# Patient Record
Sex: Female | Born: 1995 | Race: Black or African American | Hispanic: No | Marital: Single | Smoking: Never smoker
Health system: Southern US, Community
[De-identification: ages and names within clinical notes are randomized; demographics above are authoritative.]

## PROBLEM LIST (undated history)

## (undated) DIAGNOSIS — F909 Attention-deficit hyperactivity disorder, unspecified type: Secondary | ICD-10-CM

## (undated) DIAGNOSIS — F32A Depression, unspecified: Secondary | ICD-10-CM

## (undated) DIAGNOSIS — F329 Major depressive disorder, single episode, unspecified: Secondary | ICD-10-CM

## (undated) HISTORY — PX: NO PAST SURGERIES: SHX2092

---
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
## 2017-01-13 ENCOUNTER — Other Ambulatory Visit: Payer: Self-pay | Admitting: Primary Care

## 2017-01-13 DIAGNOSIS — Z3402 Encounter for supervision of normal first pregnancy, second trimester: Secondary | ICD-10-CM

## 2017-01-13 LAB — OB RESULTS CONSOLE VARICELLA ZOSTER ANTIBODY, IGG: Varicella: IMMUNE

## 2017-01-13 LAB — OB RESULTS CONSOLE RUBELLA ANTIBODY, IGM: Rubella: IMMUNE

## 2017-01-13 LAB — OB RESULTS CONSOLE GC/CHLAMYDIA
Chlamydia: NEGATIVE
Gonorrhea: NEGATIVE

## 2017-01-13 LAB — OB RESULTS CONSOLE HIV ANTIBODY (ROUTINE TESTING): HIV: NONREACTIVE

## 2017-01-13 LAB — OB RESULTS CONSOLE RPR: RPR: NONREACTIVE

## 2017-01-13 LAB — OB RESULTS CONSOLE HEPATITIS B SURFACE ANTIGEN: HEP B S AG: NEGATIVE

## 2017-02-16 ENCOUNTER — Ambulatory Visit
Admission: RE | Admit: 2017-02-16 | Discharge: 2017-02-16 | Disposition: A | Discharge status: Home / Self Care | Payer: Medicaid Other | Source: Ambulatory Visit | Attending: Primary Care | Admitting: Primary Care

## 2017-02-16 DIAGNOSIS — Z3689 Encounter for other specified antenatal screening: Secondary | ICD-10-CM | POA: Insufficient documentation

## 2017-02-16 DIAGNOSIS — Z3402 Encounter for supervision of normal first pregnancy, second trimester: Secondary | ICD-10-CM

## 2017-03-02 NOTE — L&D Delivery Note (Signed)
Date of delivery: 07/02/17 Estimated Date of Delivery: 07/02/17 Patient's last menstrual period was 09/25/2016. EGA: [redacted]w[redacted]d  Delivery Note At 3:07 AM a viable female was delivered via Vaginal, Spontaneous (Presentation: cephalic; ROA ).  APGAR: 9, 9; weight 7 lb 2.6 oz (3250 g).   Placenta status: sponatneous, intact.  Cord: 3vv, very thick wharton's jelly  with the following complications: none apparent.  Cord pH: not collected  Anesthesia:  None for delivery, lidocaine for repair Episiotomy: None Lacerations:  Anterior Left Labial and left sulcal Suture Repair: 3.0 vicryl rapide Est. Blood Loss (mL): 300   Mom presented to L&D with labor, progressed to complete, second stage: wtih delivery of fetal head with restitution to ROT.   Anterior then posterior shoulders delivered without difficulty.  Baby placed on mom's chest, and attended to by peds. Delayed cord clamping for >58min.  Cord was then clamped and cut by Grandmother.  Placenta spontaneously delivered, intact.   IV pitocin given for hemorrhage prophylaxis.  Left labial tear repaired with reapproximation to the vulva and sparing the clitoral hood of suture.  The left sulcal tear was repaired with running stitch of 3-0 vicryl.  Patient did not tolerate vaginal exams or manipulation.  We sang happy birthday to baby Halo.   Mom to postpartum.  Baby to Couplet care / Skin to Skin.  Chelsea C Ward 07/02/2017, 5:44 AM

## 2017-06-12 LAB — OB RESULTS CONSOLE GBS: STREP GROUP B AG: NEGATIVE

## 2017-06-30 ENCOUNTER — Other Ambulatory Visit: Payer: Self-pay | Admitting: Obstetrics and Gynecology

## 2017-06-30 NOTE — Progress Notes (Unsigned)
Orders placed for IOL. °

## 2017-07-02 ENCOUNTER — Other Ambulatory Visit: Payer: Self-pay

## 2017-07-02 ENCOUNTER — Inpatient Hospital Stay
Admission: EM | Admit: 2017-07-02 | Discharge: 2017-07-03 | DRG: 807 | Disposition: A | Discharge status: Home / Self Care | Payer: Medicaid Other | Source: Ambulatory Visit | Attending: Obstetrics & Gynecology | Admitting: Obstetrics & Gynecology

## 2017-07-02 DIAGNOSIS — O139 Gestational [pregnancy-induced] hypertension without significant proteinuria, unspecified trimester: Secondary | ICD-10-CM | POA: Diagnosis present

## 2017-07-02 DIAGNOSIS — O134 Gestational [pregnancy-induced] hypertension without significant proteinuria, complicating childbirth: Principal | ICD-10-CM | POA: Diagnosis present

## 2017-07-02 DIAGNOSIS — Z3A40 40 weeks gestation of pregnancy: Secondary | ICD-10-CM

## 2017-07-02 DIAGNOSIS — Z3483 Encounter for supervision of other normal pregnancy, third trimester: Secondary | ICD-10-CM | POA: Diagnosis present

## 2017-07-02 DIAGNOSIS — O0990 Supervision of high risk pregnancy, unspecified, unspecified trimester: Secondary | ICD-10-CM

## 2017-07-02 HISTORY — DX: Attention-deficit hyperactivity disorder, unspecified type: F90.9

## 2017-07-02 LAB — CBC
HEMATOCRIT: 35.2 % (ref 35.0–47.0)
HEMOGLOBIN: 12.4 g/dL (ref 12.0–16.0)
MCH: 27.6 pg (ref 26.0–34.0)
MCHC: 35.3 g/dL (ref 32.0–36.0)
MCV: 78.2 fL — AB (ref 80.0–100.0)
Platelets: 246 10*3/uL (ref 150–440)
RBC: 4.5 MIL/uL (ref 3.80–5.20)
RDW: 13.7 % (ref 11.5–14.5)
WBC: 12.4 10*3/uL — ABNORMAL HIGH (ref 3.6–11.0)

## 2017-07-02 LAB — TYPE AND SCREEN
ABO/RH(D): O POS
Antibody Screen: NEGATIVE

## 2017-07-02 MED ORDER — COCONUT OIL OIL
1.0000 | TOPICAL_OIL | Status: DC | PRN
Start: 2017-07-02 — End: 2017-07-03
  Filled 2017-07-02: qty 120

## 2017-07-02 MED ORDER — ACETAMINOPHEN 500 MG PO TABS: 1000.0000 mg | ORAL_TABLET | Freq: Four times a day (QID) | ORAL | Status: DC | PRN

## 2017-07-02 MED ORDER — HYDROMORPHONE HCL 1 MG/ML PO LIQD: 1.0000 mg | Freq: Once | ORAL | Status: DC

## 2017-07-02 MED ORDER — HYDROMORPHONE HCL 1 MG/ML IJ SOLN
INTRAMUSCULAR | Status: AC
  Administered 2017-07-02: 1 mg
  Filled 2017-07-02: qty 1

## 2017-07-02 MED ORDER — SIMETHICONE 80 MG PO CHEW: 80.0000 mg | CHEWABLE_TABLET | ORAL | Status: DC | PRN

## 2017-07-02 MED ORDER — MISOPROSTOL 200 MCG PO TABS
ORAL_TABLET | ORAL | Status: AC
  Filled 2017-07-02: qty 4

## 2017-07-02 MED ORDER — SOD CITRATE-CITRIC ACID 500-334 MG/5ML PO SOLN: 30.0000 mL | ORAL | Status: DC | PRN

## 2017-07-02 MED ORDER — HYDROCORTISONE 2.5 % RE CREA
TOPICAL_CREAM | RECTAL | Status: DC
  Filled 2017-07-02: qty 28.35

## 2017-07-02 MED ORDER — LIDOCAINE HCL (PF) 1 % IJ SOLN
INTRAMUSCULAR | Status: AC
  Administered 2017-07-02: 30 mL via SUBCUTANEOUS
  Filled 2017-07-02: qty 30

## 2017-07-02 MED ORDER — DOCUSATE SODIUM 100 MG PO CAPS
100.0000 mg | ORAL_CAPSULE | Freq: Two times a day (BID) | ORAL | Status: DC
  Administered 2017-07-02 – 2017-07-03 (×2): 100 mg via ORAL
  Filled 2017-07-02 (×2): qty 1

## 2017-07-02 MED ORDER — HYDROMORPHONE HCL 1 MG/ML IJ SOLN: 1.0000 mg | Freq: Once | INTRAMUSCULAR | Status: DC | PRN

## 2017-07-02 MED ORDER — LACTATED RINGERS IV SOLN: 500.0000 mL | INTRAVENOUS | Status: DC | PRN

## 2017-07-02 MED ORDER — WITCH HAZEL-GLYCERIN EX PADS
1.0000 "application " | MEDICATED_PAD | CUTANEOUS | Status: DC
  Filled 2017-07-02: qty 100

## 2017-07-02 MED ORDER — BENZOCAINE-MENTHOL 20-0.5 % EX AERO: 1.0000 "application " | INHALATION_SPRAY | CUTANEOUS | Status: DC | PRN

## 2017-07-02 MED ORDER — LACTATED RINGERS IV SOLN
INTRAVENOUS | Status: DC
  Administered 2017-07-02: 02:00:00 via INTRAVENOUS

## 2017-07-02 MED ORDER — AMMONIA AROMATIC IN INHA
RESPIRATORY_TRACT | Status: AC
  Filled 2017-07-02: qty 10

## 2017-07-02 MED ORDER — LIDOCAINE HCL (PF) 1 % IJ SOLN
30.0000 mL | INTRAMUSCULAR | Status: DC | PRN
  Administered 2017-07-02: 30 mL via SUBCUTANEOUS

## 2017-07-02 MED ORDER — ONDANSETRON HCL 4 MG/2ML IJ SOLN: 4.0000 mg | INTRAMUSCULAR | Status: DC | PRN

## 2017-07-02 MED ORDER — PRENATAL MULTIVITAMIN CH
1.0000 | ORAL_TABLET | Freq: Every day | ORAL | Status: DC
  Administered 2017-07-02 – 2017-07-03 (×2): 1 via ORAL
  Filled 2017-07-02 (×2): qty 1

## 2017-07-02 MED ORDER — ONDANSETRON HCL 4 MG/2ML IJ SOLN: 4.0000 mg | Freq: Four times a day (QID) | INTRAMUSCULAR | Status: DC | PRN

## 2017-07-02 MED ORDER — BUTORPHANOL TARTRATE 2 MG/ML IJ SOLN
1.0000 mg | INTRAMUSCULAR | Status: DC | PRN
  Administered 2017-07-02: 1 mg via INTRAVENOUS
  Filled 2017-07-02: qty 1

## 2017-07-02 MED ORDER — OXYTOCIN 10 UNIT/ML IJ SOLN
INTRAMUSCULAR | Status: AC
  Filled 2017-07-02: qty 2

## 2017-07-02 MED ORDER — OXYTOCIN BOLUS FROM INFUSION
500.0000 mL | Freq: Once | INTRAVENOUS | Status: AC
  Administered 2017-07-02: 500 mL via INTRAVENOUS

## 2017-07-02 MED ORDER — ONDANSETRON HCL 4 MG PO TABS: 4.0000 mg | ORAL_TABLET | ORAL | Status: DC | PRN

## 2017-07-02 MED ORDER — DIPHENHYDRAMINE HCL 25 MG PO CAPS: 25.0000 mg | ORAL_CAPSULE | Freq: Four times a day (QID) | ORAL | Status: DC | PRN

## 2017-07-02 MED ORDER — IBUPROFEN 600 MG PO TABS
600.0000 mg | ORAL_TABLET | Freq: Four times a day (QID) | ORAL | Status: DC
  Administered 2017-07-02 – 2017-07-03 (×4): 600 mg via ORAL
  Filled 2017-07-02 (×5): qty 1

## 2017-07-02 MED ORDER — OXYTOCIN 40 UNITS IN LACTATED RINGERS INFUSION - SIMPLE MED: 2.5000 [IU]/h | INTRAVENOUS | Status: DC

## 2017-07-02 NOTE — Discharge Summary (Signed)
Obstetrical Discharge Summary  Patient Name: Brittany Estrada DOB: 1996-03-01 MRN: [id number]  Date of Admission: 07/02/2017 Date of Delivery: 07/02/17 Delivered by: Ranae Plumber, MD Date of Discharge: 07/03/2017  Primary OB:   [facility]   YQM:VHQIONG'E last menstrual period was 09/25/2016. EDC Estimated Date of Delivery: 07/02/17 Gestational Age at Delivery: [redacted]w[redacted]d   Antepartum complications:  1. May have nephew with Down Syndrome 2. Anemia 3. H/o depression 4. Hypertension on admission   Admitting Diagnosis: labor, hypertension Secondary Diagnosis: Patient Active Problem List   Diagnosis Date Noted  . Labor and delivery indication for care or intervention 07/02/2017  . PIH (pregnancy induced hypertension) 07/02/2017  . High-risk pregnancy 07/02/2017    Augmentation: none Complications: None Intrapartum complications/course: Mom presented to L&D with labor, progressed to complete, second stage: wtih delivery of fetal head with restitution to ROT.   Anterior then posterior shoulders delivered without difficulty.  Baby placed on mom's chest, and attended to by peds. Delayed cord clamping for >56min.  Cord was then clamped and cut by Grandmother.  Placenta spontaneously delivered, intact.   IV pitocin given for hemorrhage prophylaxis.  Left labial tear repaired with reapproximation to the vulva and sparing the clitoral hood of suture.  The left sulcal tear was repaired with running stitch of 3-0 vicryl.  Patient did not tolerate vaginal exams or manipulation.  Date of Delivery: 07/02/17 Delivered By: Leeroy Bock Ward Delivery Type: spontaneous vaginal delivery Anesthesia: none Placenta: sponatneous Laceration: left labial and sulcal Episiotomy: none Newborn Data: Live born female "Halo" Birth Weight: 7 lb 2.6 oz (3250 g) APGAR: 9, 9  Newborn Delivery   Birth date/time:  07/02/2017 03:07:00 Delivery type:  Vaginal, Spontaneous     Postpartum Procedures: none  Post partum  course:  Patient had an uncomplicated postpartum course.  By time of discharge on PPD#1, her pain was controlled on oral pain medications; she had appropriate lochia and was ambulating, voiding without difficulty and tolerating regular diet.  She was deemed stable for discharge to home.     Discharge Physical Exam:  BP 130/86 (BP Location: Left Arm)   Pulse 75   Temp 98 F (36.7 C) (Oral)   Resp 18   Ht  (1.6 m)   Wt 134 lb (60.8 kg)   LMP 09/25/2016   SpO2 99%   Breastfeeding? Unknown   BMI 23.74 kg/m   General: NAD CV: RRR Pulm: CTABL, nl effort ABD: s/nd/nt, fundus firm and below the umbilicus Lochia: moderate DVT Evaluation: LE non-ttp, no evidence of DVT on exam.  Hemoglobin  Date Value Ref Range Status  07/03/2017 11.0 (L) 12.0 - 16.0 g/dL Final   HGB  Date Value Ref Range Status  04/11/2012 12.2 12.0 - 16.0 g/dL Final   HCT  Date Value Ref Range Status  07/03/2017 31.4 (L) 35.0 - 47.0 % Final  04/11/2012 36.3 35.0 - 47.0 % Final     Disposition: stable, discharge to home. Baby Feeding: breastmilk and formula Baby Disposition: home with mom  Rh Immune globulin given: n/a Rubella vaccine given: n/a Tdap vaccine given in AP or PP setting: AP Flu vaccine given in AP or PP setting: AP  Contraception: TBD  Prenatal Labs:  Blood type/Rh O+  Antibody screen neg  Rubella Immune  Varicella Immune  RPR NR  HBsAg Neg  HIV NR  GC neg  Chlamydia neg  Genetic screening Not done  1 hour GTT 81  3 hour GTT   GBS Neg 06/12/17  Plan:  VIRGINA DEAKINS was discharged to home in good condition. Follow-up appointment with Dr. Elesa Massed in 4-6 weeks.  Discharge Medications: Allergies as of 07/03/2017   No Known Allergies     Medication List    STOP taking these medications   amoxicillin-clavulanate 875-125 MG tablet Commonly known as:  AUGMENTIN   predniSONE 10 MG tablet Commonly known as:  DELTASONE     TAKE these medications    acetaminophen 500 MG tablet Commonly known as:  TYLENOL Take 2 tablets (1,000 mg total) by mouth every 6 (six) hours as needed (for pain scale < 4).   ferrous sulfate 325 (65 FE) MG EC tablet Take 325 mg by mouth 3 (three) times daily with meals.   ibuprofen 600 MG tablet Commonly known as:  ADVIL,MOTRIN Take 1 tablet (600 mg total) by mouth every 6 (six) hours.   lisdexamfetamine 40 MG capsule Commonly known as:  VYVANSE Take 40 mg by mouth daily.   prenatal multivitamin Tabs tablet Take 1 tablet by mouth daily at 12 noon.       Follow-up Information    Ward, Elenora Fender, MD Follow up in 2 week(s).   Specialty:  Obstetrics and Gynecology Why:  Please follow-up in 2 weeks for postpartum and BP recheck Contact information: [address] [place] [address] [phone]        [facility] Follow up in 6 week(s).   Specialty:  General Practice Why:  Make appointment in 6 weeks for postpartum follow-up Contact information: [address] [place] [address] [phone]           Signed: Heloise Ochoa A, CNM 07/03/2017 4:20 PM

## 2017-07-02 NOTE — H&P (Signed)
OB History & Physical   History of Present Illness:  Chief Complaint:   HPI:  Brittany Estrada is a 22 y.o. G1P0 female at [redacted]w[redacted]d dated by Patient's last menstrual period was 09/25/2016. confirmed with a 20 week ultrasound. Estimated Date of Delivery: 07/02/17  She presents to L&D for abdominal pain and is found to be in labor. She also has elevated blood pressures, which she has not had previously  +FM, + CTX, no LOF, no VB Denies: HA, visual changes, SOB, or RUQ/epigastric pain  Pregnancy Issues: 1. May have nephew with Down Syndrome 2. Anemia 3. H/o depression 4. Hypertension on admission  Maternal Medical History:   Past Medical History:  Diagnosis Date  . ADHD   . Depression     Past Surgical History:  Procedure Laterality Date  . NO PAST SURGERIES      No Known Allergies  Prior to Admission medications   Medication Sig Start Date End Date Taking? Authorizing Provider  ferrous sulfate 325 (65 FE) MG EC tablet Take 325 mg by mouth 3 (three) times daily with meals.   Yes [provider]  Prenatal Vit-Fe Fumarate-FA (PRENATAL MULTIVITAMIN) TABS tablet Take 1 tablet by mouth daily at 12 noon.   Yes [provider]  amoxicillin-clavulanate (AUGMENTIN) 875-125 MG tablet Take 1 tablet by mouth 2 (two) times daily. Patient not taking: Reported on 07/02/2017 04/27/15   Houston Siren, MD  lisdexamfetamine (VYVANSE) 40 MG capsule Take 40 mg by mouth daily.    [provider]  predniSONE (DELTASONE) 10 MG tablet Label  & dispense according to the schedule below. 5 Pills PO for 1 day then, 4 Pills PO for 1 day, 3 Pills PO for 1 day, 2 Pills PO for 1 day, 1 Pill PO for 1 days then STOP. Patient not taking: Reported on 07/02/2017 04/27/15   Houston Siren, MD     Pren[facility] care site: [facility]    Social History: She  reports that she has never smoked. She has never used smokeless tobacco. She reports that she does not drink alcohol or use  drugs.  Family History: family history includes Kidney failure in her father.   Review of Systems: A full review of systems was performed and negative except as noted in the HPI.     Physical Exam:  Vital Signs: BP (!) 155/106   Pulse 99   Temp 98.3 F (36.8 C) (Oral)   Resp 18   Ht  (1.6 m)   Wt 60.8 kg (134 lb)   BMI 23.74 kg/m  General: no acute distress.  HEENT: normocephalic, atraumatic Heart: regular rate & rhythm.  No murmurs/rubs/gallops Lungs: clear to auscultation bilaterally, normal respiratory effort Abdomen: soft, gravid, non-tender;  EFW: 7lbs Pelvic:   External: Normal external female genitalia  Cervix: Dilation: 3.5 / Effacement (%): 90 / Station: -1    Extremities: non-tender, symmetric, no edema bilaterally.  DTRs: 2+  Neurologic: Alert & oriented x 3.      Pertinent Results:  Prenatal Labs: Blood type/Rh O+  Antibody screen neg  Rubella Immune  Varicella Immune  RPR NR  HBsAg Neg  HIV NR  GC neg  Chlamydia neg  Genetic screening Not done  1 hour GTT 81  3 hour GTT   GBS Neg 06/12/17   FHT: 135 mod + accels no decels TOCO: q66min SVE:  Dilation: 3.5 / Effacement (%): 90 / Station: -1    Cephalic by leopolds   Assessment:  Brittany Estrada is a 22 y.o. G1P0 female at [redacted]w[redacted]d with labor.   Plan:  1. Admit to Labor & Delivery 2. CBC, T&S, Clrs, IVF 3. GBS  neg 4. Consents obtained. 5. Continuous efm/toco 6. Category 1 tracing 7. Expectant managment  ----- Ranae Plumber, MD Attending Obstetrician and Gynecologist [facility], Department of OB/GYN [facility]

## 2017-07-02 NOTE — Discharge Instructions (Signed)

## 2017-07-02 NOTE — Progress Notes (Signed)
Patient is doing well today. Fundus firm, bleeding small, pain controlled by scheduled motrin, ambulating independently. Flat affect noted, some smiles off and on- support person at bedside very involved.

## 2017-07-02 NOTE — OB Triage Note (Signed)
  Patient came in at 0015 from home via EMS complaining of lower abdominal pain and back pain that started at 0800 07/01/2017. Patient reports abdominal pain to be constant. Rating pain a 9 out of 10. Patient reports positive fetal movement. Reports scant vaginal bleeding when wiping. Reports to loosing mucous plug 07/01/2017. Denies leakage of fluids. Upon RN assessment contractions palpate soft. External fetal monitors applied. Initial blood pressure 144/100. Temperature 98.0 F. Will inform provider and continue to assess and monitor. Call bell within reach. Bed in lowest position. Side rails up. Family at bedside.

## 2017-07-02 NOTE — Progress Notes (Signed)
Post Partum Day 0 Subjective: Doing well, no complaints.  Tolerating regular diet, pain with PO meds, voiding and ambulating without difficulty.  No CP SOB Fever,Chills, N/V or leg pain; denies nipple or breast pain no HA change of vision, RUQ/epigastric pain  Objective: BP 128/84 (BP Location: Left Arm)   Pulse 86   Temp 98.1 F (36.7 C) (Oral)   Resp 17   Ht  (1.6 m)   Wt 134 lb (60.8 kg)   LMP 09/25/2016   SpO2 99%   Breastfeeding? Unknown   BMI 23.74 kg/m    Physical Exam:  General: NAD Breasts: soft/nontender CV: RRR Pulm: nl effort, CTABL Abdomen: soft, NT, BS x 4 Perineum: minimal edema, repair well approximated Lochia: moderate Uterine Fundus: fundus firm and 1 fb below umbilicus DVT Evaluation: no cords, ttp LEs   Recent Labs    07/02/17 0144  HGB 12.4  HCT 35.2  WBC 12.4*  PLT 246    Assessment/Plan: 22 y.o. G1P1001 postpartum day # 0  - Continue routine PP care - encouraged snug fitting bra and cabbage leaves for bottlefeeding.  - Discussed contraceptive options including implant, IUDs hormonal and non-hormonal, injection, pills/ring/patch, condoms, and NFP.  - Immunization status: all Imms up to date    Disposition: Does not desire Dc home today.     Renesha Lizama A, CNM 07/02/2017  1:28 PM

## 2017-07-03 LAB — CBC
HEMATOCRIT: 31.4 % — AB (ref 35.0–47.0)
HEMOGLOBIN: 11 g/dL — AB (ref 12.0–16.0)
MCH: 27.7 pg (ref 26.0–34.0)
MCHC: 34.9 g/dL (ref 32.0–36.0)
MCV: 79.4 fL — ABNORMAL LOW (ref 80.0–100.0)
Platelets: 196 10*3/uL (ref 150–440)
RBC: 3.96 MIL/uL (ref 3.80–5.20)
RDW: 13.4 % (ref 11.5–14.5)
WBC: 12.1 10*3/uL — AB (ref 3.6–11.0)

## 2017-07-03 LAB — RPR: RPR Ser Ql: NONREACTIVE

## 2017-07-03 MED ORDER — ACETAMINOPHEN 500 MG PO TABS: 1000.0000 mg | ORAL_TABLET | Freq: Four times a day (QID) | ORAL | 0 refills | Status: DC | PRN

## 2017-07-03 MED ORDER — IBUPROFEN 600 MG PO TABS: 600.0000 mg | ORAL_TABLET | Freq: Four times a day (QID) | ORAL | 0 refills | Status: DC

## 2017-07-03 NOTE — Progress Notes (Signed)
Post Partum Day 1 Subjective: Doing well, no complaints.  Tolerating regular diet, pain with PO meds, voiding and ambulating without difficulty.  No CP SOB Fever,Chills, N/V or leg pain; denies nipple or breast pain, no HA change of vision, RUQ/epigastric pain  Objective: BP 122/90 (BP Location: Right Arm)   Pulse 83   Temp 98.7 F (37.1 C) (Oral)   Resp 18   Ht  (1.6 m)   Wt 134 lb (60.8 kg)   LMP 09/25/2016   SpO2 99%   Breastfeeding? Unknown   BMI 23.74 kg/m    Physical Exam:  General: NAD Breasts: soft/nontender- bottlefeeding CV: RRR Pulm: nl effort, CTABL Abdomen: soft, NT, BS x 4 Perineum: minimal edema, intact/lacerations hemostatic/repair well approximated Lochia: moderate Uterine Fundus: fundus firm and 1 fb below umbilicus DVT Evaluation: no cords, ttp LEs   Recent Labs    07/02/17 0144 07/03/17 0524  HGB 12.4 11.0*  HCT 35.2 31.4*  WBC 12.4* 12.1*  PLT 246 196    Assessment/Plan: 22 y.o. G1P1001 postpartum day # 1  - Continue routine PP care - encouraged snug fitting bra and cabbage leaves for bottlefeeding.  - Discussed contraceptive options: pt unsure of desired method, has used nothing in the past.  Reviewed abstinence for 6weeks, consider OCPs, briefly discussed LARC methods - Immunization status:  all Imms up to date - intermittent mild range BP this am, asymptomatic, will recheck BP at lunch.   Disposition: desires DC today    McVey, REBECCA A, CNM 07/03/2017  9:18 AM

## 2017-07-03 NOTE — Progress Notes (Signed)
Reviewed D/C instructions with pt and family. Pt stated they have already watched purple cry video overnight on previous shift. Pt verbalized understanding of teaching. Discharged to home via W/C. Pt to schedule f/u appt in 2wks and 6wks. Will monitor BPs at home and verbalized understanding of when/why to call provider.

## 2017-08-22 ENCOUNTER — Encounter: Payer: Self-pay | Admitting: Emergency Medicine

## 2017-08-22 ENCOUNTER — Other Ambulatory Visit: Payer: Self-pay

## 2017-08-22 ENCOUNTER — Emergency Department
Admission: EM | Admit: 2017-08-22 | Discharge: 2017-08-22 | Disposition: A | Discharge status: Home / Self Care | Payer: Medicaid Other | Attending: Emergency Medicine | Admitting: Emergency Medicine

## 2017-08-22 DIAGNOSIS — B3731 Acute candidiasis of vulva and vagina: Secondary | ICD-10-CM

## 2017-08-22 DIAGNOSIS — B373 Candidiasis of vulva and vagina: Secondary | ICD-10-CM | POA: Insufficient documentation

## 2017-08-22 DIAGNOSIS — N898 Other specified noninflammatory disorders of vagina: Secondary | ICD-10-CM | POA: Diagnosis present

## 2017-08-22 DIAGNOSIS — Z79899 Other long term (current) drug therapy: Secondary | ICD-10-CM | POA: Insufficient documentation

## 2017-08-22 LAB — URINALYSIS, COMPLETE (UACMP) WITH MICROSCOPIC
Bilirubin Urine: NEGATIVE
Glucose, UA: NEGATIVE mg/dL
HGB URINE DIPSTICK: NEGATIVE
KETONES UR: NEGATIVE mg/dL
Nitrite: NEGATIVE
Protein, ur: NEGATIVE mg/dL
SPECIFIC GRAVITY, URINE: 1.006 (ref 1.005–1.030)
pH: 7 (ref 5.0–8.0)

## 2017-08-22 LAB — POCT PREGNANCY, URINE: PREG TEST UR: NEGATIVE

## 2017-08-22 MED ORDER — FLUCONAZOLE 50 MG PO TABS: 150.0000 mg | ORAL_TABLET | Freq: Every day | ORAL | 0 refills | Status: AC

## 2017-08-22 NOTE — ED Provider Notes (Signed)
[facility] Emergency Department Provider Note  ____________________________________________   First MD Initiated Contact with Patient 08/22/17 2337     (approximate)  I have reviewed the triage vital signs and the nursing notes.   HISTORY  Chief Complaint Vaginitis   HPI Brittany CromeBrittany S Estrada is a 22 y.o. female who self presents to the emergency department with 2 days of vaginal itching.  She gave birth about 2 months ago and has been asymptomatic until the last 2 days.  She notes a small red "rash" around the outside of her labia majora.  No dysuria.  No vaginal discharge.  No foul smell.  No fevers or chills.  No abdominal pain nausea vomiting.  No back pain.  She is taken no medications and nothing seems to work.  Past Medical History:  Diagnosis Date  . ADHD   . Depression     Patient Active Problem List   Diagnosis Date Noted  . Labor and delivery indication for care or intervention 07/02/2017  . PIH (pregnancy induced hypertension) 07/02/2017  . High-risk pregnancy 07/02/2017    Past Surgical History:  Procedure Laterality Date  . NO PAST SURGERIES      Prior to Admission medications   Medication Sig Start Date End Date Taking? Authorizing Provider  acetaminophen (TYLENOL) 500 MG tablet Take 2 tablets (1,000 mg total) by mouth every 6 (six) hours as needed (for pain scale < 4). 07/03/17   McVey, Prudencio Pairebecca A, CNM  ferrous sulfate 325 (65 FE) MG EC tablet Take 325 mg by mouth 3 (three) times daily with meals.    [provider]  fluconazole (DIFLUCAN) 50 MG tablet Take 3 tablets (150 mg total) by mouth daily for 1 day. 08/22/17 08/23/17  Merrily Brittleifenbark, Jerlean Peralta, MD  ibuprofen (ADVIL,MOTRIN) 600 MG tablet Take 1 tablet (600 mg total) by mouth every 6 (six) hours. 07/03/17   McVey, Prudencio Pairebecca A, CNM  lisdexamfetamine (VYVANSE) 40 MG capsule Take 40 mg by mouth daily.    [provider]  Prenatal Vit-Fe Fumarate-FA (PRENATAL MULTIVITAMIN) TABS  tablet Take 1 tablet by mouth daily at 12 noon.    [provider]    Allergies Patient has no known allergies.  Family History  Problem Relation Age of Onset  . Kidney failure Father     Social History Social History   Tobacco Use  . Smoking status: Never Smoker  . Smokeless tobacco: Never Used  Substance Use Topics  . Alcohol use: No    Alcohol/week: 0.0 oz  . Drug use: No    Review of Systems Constitutional: No fever/chills Cardiovascular: Denies chest pain. Respiratory: Denies shortness of breath. Gastrointestinal: No abdominal pain.  No nausea, no vomiting.   GU: Positive for vaginal itching Musculoskeletal: Negative for back pain. Neurological: Negative for headaches   ____________________________________________   PHYSICAL EXAM:  VITAL SIGNS: ED Triage Vitals  Enc Vitals Group     BP 08/22/17 2211 118/66     Pulse Rate 08/22/17 2211 88     Resp 08/22/17 2211 18     Temp 08/22/17 2211 98.7 F (37.1 C)     Temp Source 08/22/17 2211 Oral     SpO2 08/22/17 2211 99 %     Weight 08/22/17 2209 130 lb (59 kg)     Height 08/22/17 2209 5\' 4"  (1.626 m)     Head Circumference --      Peak Flow --      Pain Score 08/22/17 2209 6  Pain Loc --      Pain Edu? --      Excl. in GC? --     Constitutional: Alert and oriented x4 pleasant cooperative speaks full clear sentences no diaphoresis Head: Atraumatic. Nose: No congestion/rhinnorhea. Mouth/Throat: No trismus Neck: No stridor.   Cardiovascular: Regular rate and rhythm Respiratory: Normal respiratory effort.  No retractions. Neurologic:  Normal speech and language. No gross focal neurologic deficits are appreciated.  Skin:  Skin is warm, dry and intact. No rash noted.    ____________________________________________  LABS (all labs ordered are listed, but only abnormal results are displayed)  Labs Reviewed  URINALYSIS, COMPLETE (UACMP) WITH MICROSCOPIC - Abnormal; Notable for the following  components:      Result Value   Color, Urine STRAW (*)    APPearance HAZY (*)    Leukocytes, UA SMALL (*)    Bacteria, UA FEW (*)    All other components within normal limits  POCT PREGNANCY, URINE    Lab work reviewed by me with small leukocytes and not pregnant __________________________________________  EKG   ____________________________________________  RADIOLOGY   ____________________________________________   DIFFERENTIAL includes but not limited to  Vulvovaginitis, syphilis, gonorrhea, UTI, pyelonephritis   PROCEDURES  Procedure(s) performed: no  Procedures  Critical Care performed: no  Observation: no ____________________________________________   INITIAL IMPRESSION / ASSESSMENT AND PLAN / ED COURSE  Pertinent labs & imaging results that were available during my care of the patient were reviewed by me and considered in my medical decision making (see chart for details).  The patient's symptoms are most consistent with candidal vulvovaginitis.  No need for pelvic exam unless she fails outpatient management.  I will prescribe her Diflucan for home but also I explained to the patient she could take over-the-counter Monistat.  Discharged home in improved condition verbalizes understanding agreement the plan.      ____________________________________________   FINAL CLINICAL IMPRESSION(S) / ED DIAGNOSES  Final diagnoses:  Candidal vulvovaginitis      NEW MEDICATIONS STARTED DURING THIS VISIT:  Discharge Medication List as of 08/22/2017 11:38 PM    START taking these medications   Details  fluconazole (DIFLUCAN) 50 MG tablet Take 3 tablets (150 mg total) by mouth daily for 1 day., Starting Sun 08/22/2017, Until Mon 08/23/2017, Print         Note:  This document was prepared using Dragon voice recognition software and may include unintentional dictation errors.      Merrily Brittle, MD 08/23/17 [phone]

## 2017-08-22 NOTE — Discharge Instructions (Signed)
It was a pleasure to take care of you today, and thank you for coming to our emergency department.  If you have any questions or concerns before leaving please ask the nurse to grab me and I'm more than happy to go through your aftercare instructions again.  If you were prescribed any opioid pain medication today such as Norco, Vicodin, Percocet, morphine, hydrocodone, or oxycodone please make sure you do not drive when you are taking this medication as it can alter your ability to drive safely.  If you have any concerns once you are home that you are not improving or are in fact getting worse before you can make it to your follow-up appointment, please do not hesitate to call 911 and come back for further evaluation.  Merrily BrittleNeil Anamari Galeas, MD  Results for orders placed or performed during the hospital encounter of 08/22/17  Urinalysis, Complete w Microscopic  Result Value Ref Range   Color, Urine STRAW (A) YELLOW   APPearance HAZY (A) CLEAR   Specific Gravity, Urine 1.006 1.005 - 1.030   pH 7.0 5.0 - 8.0   Glucose, UA NEGATIVE NEGATIVE mg/dL   Hgb urine dipstick NEGATIVE NEGATIVE   Bilirubin Urine NEGATIVE NEGATIVE   Ketones, ur NEGATIVE NEGATIVE mg/dL   Protein, ur NEGATIVE NEGATIVE mg/dL   Nitrite NEGATIVE NEGATIVE   Leukocytes, UA SMALL (A) NEGATIVE   RBC / HPF 0-5 0 - 5 RBC/hpf   WBC, UA 0-5 0 - 5 WBC/hpf   Bacteria, UA FEW (A) NONE SEEN   Squamous Epithelial / LPF 0-5 0 - 5  Pregnancy, urine POC  Result Value Ref Range   Preg Test, Ur NEGATIVE NEGATIVE

## 2017-08-22 NOTE — ED Triage Notes (Signed)
Pt arrives ambulatory to triage with c/o yeast infection in her vaginal area. Pt reports itching and swelling but denies bleeding or DC. Pt is in NAD.

## 2017-09-26 IMAGING — CT CT NECK W/ CM
2 of 3 series · 7 of 14 positions shown, 8 images · IV contrast (omnipaque)
Comparison: None.

ADDENDUM:
Slight nodularity along the posterior wall of the upper trachea, see
for instance image 69 series 2, does not contribute to significant
subglottic narrowing.
CLINICAL DATA: Sore throat, congestion, and body aches. These are
worse on the RIGHT.

EXAM:
CT NECK WITH CONTRAST
TECHNIQUE: Multidetector CT imaging of the neck was performed using the
standard protocol following the bolus administration of intravenous
contrast.
CONTRAST:  75mL OMNIPAQUE IOHEXOL 300 MG/ML  SOLN

[Series 2: axial neck · axial · 0.44mm/px · z∈[+536,+668]mm · 4 of 110 slices shown]
[im 22/110  bone]
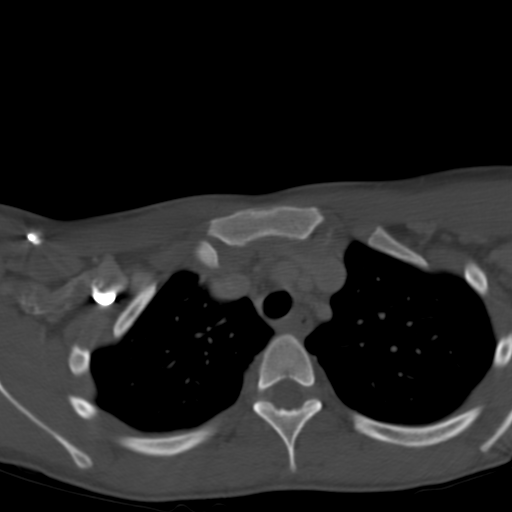
[im 44/110  bone]
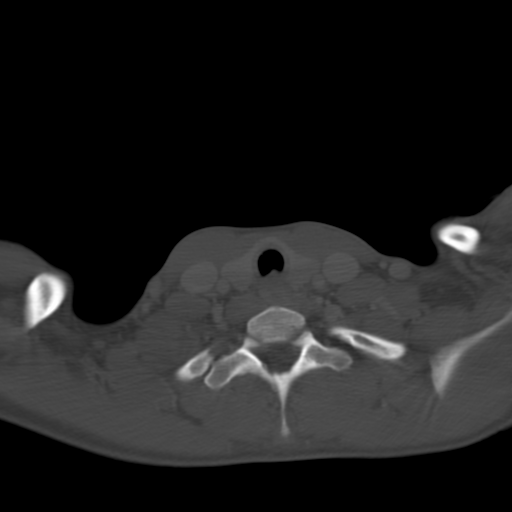
[im 66/110  bone]
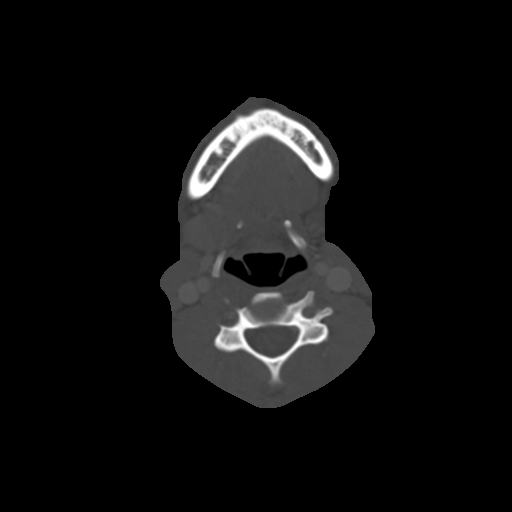
[im 88/110  bone]
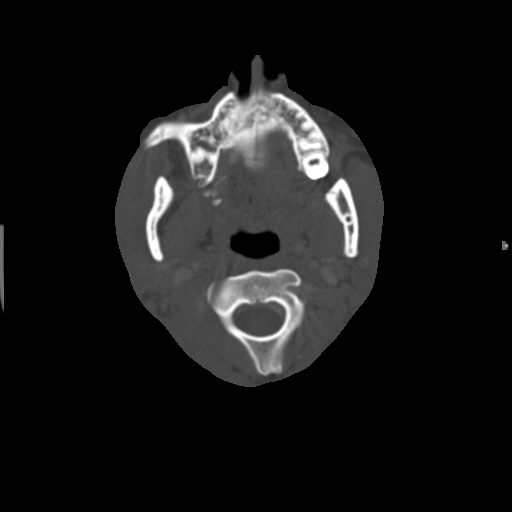

[Series 6: ax oropharynx · axial · 0.44mm/px · z∈[+542,+652]mm · 3 of 111 slices shown, 4 images]
[im 28/111  soft-tissue]
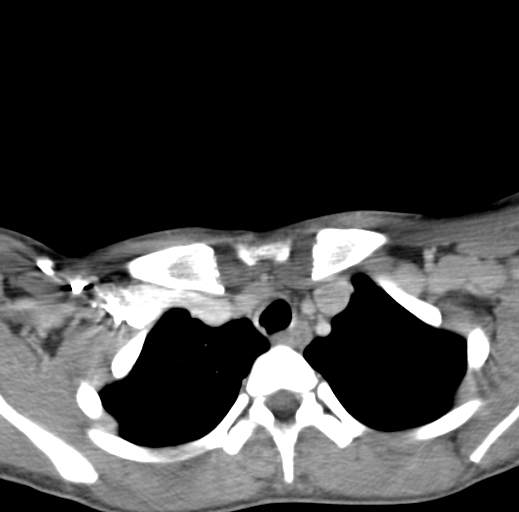
[im 28/111  bone]
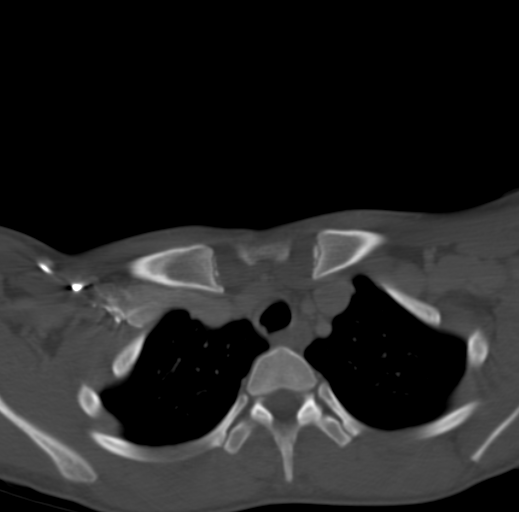
[im 56/111  bone]
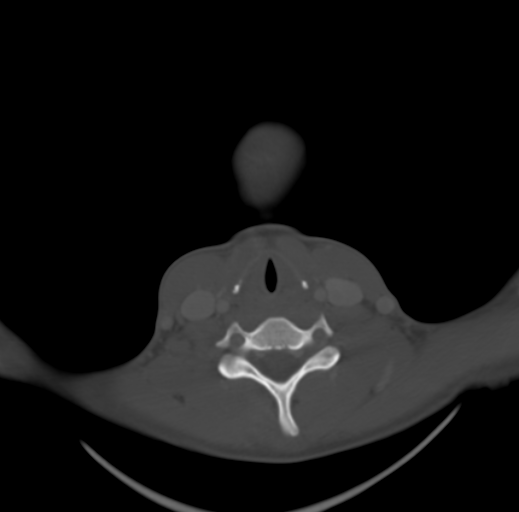
[im 83/111  bone]
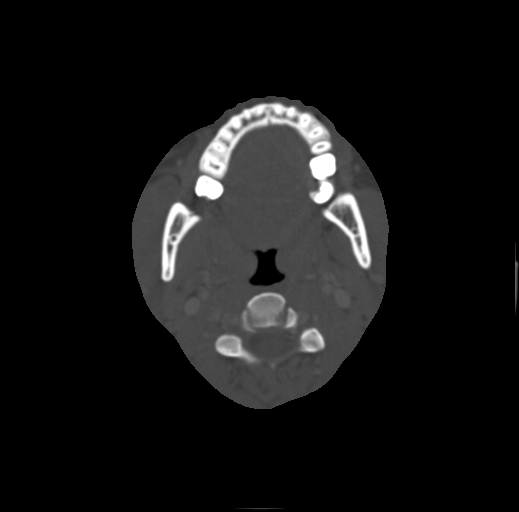

[7 of 14 positions shown; findings below may reference images not displayed]

FINDINGS: Pharynx and larynx: Asymmetric enlargement of the RIGHT
nasopharyngeal adenoid/lymphoid tissue. No middle ear fluid. Trace
RIGHT mastoid effusion. No lingual/faucial tonsillar or
peritonsillar abscess. No retropharyngeal effusion or abscess.
Normal hypopharynx and larynx.

Salivary glands: Negative

Thyroid: Negative

Lymph nodes: Probable mild reactive adenopathy.

Vascular: Patent.

Limited intracranial: Negative.

Visualized orbits: Negative.

Mastoids and visualized paranasal sinuses: Trace RIGHT mastoid
effusion. No sinus air-fluid level. Small mastoid sinuses.

Skeleton: No worrisome osseous lesion.

Upper chest: Lung apices clear.
IMPRESSION: Asymmetric enlargement of the RIGHT nasopharyngeal adenoid/lymphoid
tissue. Findings consistent with uncomplicated
pharyngitis/tonsillitis. No tonsillar or peritonsillar abscess. No
retropharyngeal effusion or abscess.

## 2018-07-13 ENCOUNTER — Emergency Department
Admission: EM | Admit: 2018-07-13 | Discharge: 2018-07-13 | Disposition: A | Discharge status: Home / Self Care | Payer: Medicaid Other | Attending: Emergency Medicine | Admitting: Emergency Medicine

## 2018-07-13 ENCOUNTER — Encounter: Payer: Self-pay | Admitting: *Deleted

## 2018-07-13 ENCOUNTER — Other Ambulatory Visit: Payer: Self-pay

## 2018-07-13 DIAGNOSIS — Z79899 Other long term (current) drug therapy: Secondary | ICD-10-CM | POA: Insufficient documentation

## 2018-07-13 DIAGNOSIS — R109 Unspecified abdominal pain: Secondary | ICD-10-CM | POA: Insufficient documentation

## 2018-07-13 DIAGNOSIS — R112 Nausea with vomiting, unspecified: Secondary | ICD-10-CM

## 2018-07-13 LAB — URINALYSIS, COMPLETE (UACMP) WITH MICROSCOPIC
Bacteria, UA: NONE SEEN
Bilirubin Urine: NEGATIVE
Glucose, UA: NEGATIVE mg/dL
Ketones, ur: 80 mg/dL — AB
Leukocytes,Ua: NEGATIVE
Nitrite: NEGATIVE
Protein, ur: 30 mg/dL — AB
Specific Gravity, Urine: 1.031 — ABNORMAL HIGH (ref 1.005–1.030)
pH: 5 (ref 5.0–8.0)

## 2018-07-13 LAB — CBC
HCT: 40.6 % (ref 36.0–46.0)
Hemoglobin: 13.4 g/dL (ref 12.0–15.0)
MCH: 27.9 pg (ref 26.0–34.0)
MCHC: 33 g/dL (ref 30.0–36.0)
MCV: 84.6 fL (ref 80.0–100.0)
Platelets: 334 10*3/uL (ref 150–400)
RBC: 4.8 MIL/uL (ref 3.87–5.11)
RDW: 11.9 % (ref 11.5–15.5)
WBC: 7.1 10*3/uL (ref 4.0–10.5)
nRBC: 0 % (ref 0.0–0.2)

## 2018-07-13 LAB — COMPREHENSIVE METABOLIC PANEL
ALT: 10 U/L (ref 0–44)
AST: 16 U/L (ref 15–41)
Albumin: 4.5 g/dL (ref 3.5–5.0)
Alkaline Phosphatase: 40 U/L (ref 38–126)
Anion gap: 10 (ref 5–15)
BUN: 16 mg/dL (ref 6–20)
CO2: 21 mmol/L — ABNORMAL LOW (ref 22–32)
Calcium: 9.4 mg/dL (ref 8.9–10.3)
Chloride: 107 mmol/L (ref 98–111)
Creatinine, Ser: 0.81 mg/dL (ref 0.44–1.00)
GFR calc Af Amer: >60 mL/min (ref >60)
GFR calc non Af Amer: >60 mL/min (ref >60)
Glucose, Bld: 80 mg/dL (ref 70–99)
Potassium: 3.5 mmol/L (ref 3.5–5.1)
Sodium: 138 mmol/L (ref 135–145)
Total Bilirubin: 1.7 mg/dL — ABNORMAL HIGH (ref 0.3–1.2)
Total Protein: 8.2 g/dL — ABNORMAL HIGH (ref 6.5–8.1)

## 2018-07-13 LAB — URINE DRUG SCREEN, QUALITATIVE (ARMC ONLY)
Amphetamines, Ur Screen: NOT DETECTED
Barbiturates, Ur Screen: NOT DETECTED
Benzodiazepine, Ur Scrn: NOT DETECTED
Cannabinoid 50 Ng, Ur ~~LOC~~: POSITIVE — AB
Cocaine Metabolite,Ur ~~LOC~~: NOT DETECTED
MDMA (Ecstasy)Ur Screen: NOT DETECTED
Methadone Scn, Ur: NOT DETECTED
Opiate, Ur Screen: NOT DETECTED
Phencyclidine (PCP) Ur S: NOT DETECTED
Tricyclic, Ur Screen: NOT DETECTED

## 2018-07-13 LAB — POCT PREGNANCY, URINE: Preg Test, Ur: NEGATIVE

## 2018-07-13 LAB — LIPASE, BLOOD: Lipase: 28 U/L (ref 11–51)

## 2018-07-13 MED ORDER — ONDANSETRON 4 MG PO TBDP
4.0000 mg | ORAL_TABLET | Freq: Once | ORAL | Status: AC
  Administered 2018-07-13: 19:00:00 4 mg via ORAL
  Filled 2018-07-13: qty 1

## 2018-07-13 NOTE — Discharge Instructions (Signed)
You should stay on a bland diet for at least the next 12 hours then progress to your normal diet as tolerated.  Please follow-up with your primary care provider for symptoms that are not improving over the next couple of days.  If you are unable to see primary care and have concerns, for return to the emergency department.

## 2018-07-13 NOTE — ED Triage Notes (Signed)
Pt has abd pain and vomiting since yesterday.  No diarrhea.  Menses now.  No urinary  sx.  Pt alert.

## 2018-07-13 NOTE — ED Provider Notes (Signed)
[facility] Emergency Department Provider Note  ____________________________________________  Time seen: Approximately 5:43 PM  I have reviewed the triage vital signs and the nursing notes.   HISTORY  Chief Complaint Abdominal Pain and Emesis    HPI Brittany Estrada is a 23 y.o. female who presents to the emergency department for treatment and evaluation of nausea and vomiting.  Symptoms started last night.  Patient states that she had chills and felt sick in her stomach before going to bed.  She states that she awakened 3 separate times and vomited up "clear stuff."  She denies fever and states that she checked it with a thermometer which read 98.  She is currently on her menstrual cycle and states that it is currently heavy.  She has a history of anemia and was concerned that may be her hemoglobin has dropped which caused all of her symptoms.  She estimates that she is going through a pad or tampon every couple of hours which is normal for her.   Past Medical History:  Diagnosis Date  . ADHD   . Depression     Patient Active Problem List   Diagnosis Date Noted  . Labor and delivery indication for care or intervention 07/02/2017  . PIH (pregnancy induced hypertension) 07/02/2017  . High-risk pregnancy 07/02/2017    Past Surgical History:  Procedure Laterality Date  . NO PAST SURGERIES      Prior to Admission medications   Medication Sig Start Date End Date Taking? Authorizing Provider  acetaminophen (TYLENOL) 500 MG tablet Take 2 tablets (1,000 mg total) by mouth every 6 (six) hours as needed (for pain scale < 4). 07/03/17   McVey, Prudencio Pairebecca A, CNM  ferrous sulfate 325 (65 FE) MG EC tablet Take 325 mg by mouth 3 (three) times daily with meals.    [provider]  ibuprofen (ADVIL,MOTRIN) 600 MG tablet Take 1 tablet (600 mg total) by mouth every 6 (six) hours. 07/03/17   McVey, Prudencio Pairebecca A, CNM  lisdexamfetamine (VYVANSE) 40 MG capsule Take 40 mg by  mouth daily.    [provider]  Prenatal Vit-Fe Fumarate-FA (PRENATAL MULTIVITAMIN) TABS tablet Take 1 tablet by mouth daily at 12 noon.    [provider]    Allergies Patient has no known allergies.  Family History  Problem Relation Age of Onset  . Kidney failure Father     Social History Social History   Tobacco Use  . Smoking status: Never Smoker  . Smokeless tobacco: Never Used  Substance Use Topics  . Alcohol use: No    Alcohol/week: 0.0 standard drinks  . Drug use: No    Review of Systems Constitutional: Negative for fever. Respiratory: Negative for shortness of breath or cough. Gastrointestinal: Positive for abdominal pain earlier today-resolved; positive for nausea , positive for vomiting. Genitourinary: Negative for dysuria , negative for vaginal discharge. Negative for dyspareunia or concern for STD. Musculoskeletal: Negative for back pain. Skin: Negative for acute skin changes/rash/lesion. ____________________________________________   PHYSICAL EXAM:  VITAL SIGNS: ED Triage Vitals  Enc Vitals Group     BP 07/13/18 1645 (!) 167/82     Pulse Rate 07/13/18 1645 82     Resp 07/13/18 1645 18     Temp 07/13/18 1645 99 F (37.2 C)     Temp Source 07/13/18 1645 Oral     SpO2 07/13/18 1645 100 %     Weight 07/13/18 1646 120 lb (54.4 kg)     Height 07/13/18  1646 5\' 3"  (1.6 m)     Head Circumference --      Peak Flow --      Pain Score 07/13/18 1646 5     Pain Loc --      Pain Edu? --      Excl. in GC? --     Constitutional: Alert and oriented. Well appearing and in no acute distress. Eyes: Conjunctivae are normal. Head: Atraumatic. Nose: No congestion/rhinnorhea. Mouth/Throat: Mucous membranes are moist. Respiratory: Normal respiratory effort.  No retractions. Gastrointestinal: Bowel sounds active x 4; Abdomen is soft without rebound or guarding. Genitourinary: Pelvic exam: Not indicated Musculoskeletal: No extremity tenderness nor  edema.  Neurologic:  Normal speech and language. No gross focal neurologic deficits are appreciated. Speech is normal. No gait instability. Skin:  Skin is warm, dry and intact. No rash noted on exposed skin. Psychiatric: Mood and affect are normal. Speech and behavior are normal.  ____________________________________________   LABS (all labs ordered are listed, but only abnormal results are displayed)  Labs Reviewed  COMPREHENSIVE METABOLIC PANEL - Abnormal; Notable for the following components:      Result Value   CO2 21 (*)    Total Protein 8.2 (*)    Total Bilirubin 1.7 (*)    All other components within normal limits  URINALYSIS, COMPLETE (UACMP) WITH MICROSCOPIC - Abnormal; Notable for the following components:   Color, Urine YELLOW (*)    APPearance CLEAR (*)    Specific Gravity, Urine 1.031 (*)    Hgb urine dipstick SMALL (*)    Ketones, ur 80 (*)    Protein, ur 30 (*)    All other components within normal limits  URINE DRUG SCREEN, QUALITATIVE ([facility] ONLY) - Abnormal; Notable for the following components:   Cannabinoid 50 Ng, Ur [place] POSITIVE (*)    All other components within normal limits  LIPASE, BLOOD  CBC  POCT PREGNANCY, URINE   ____________________________________________  RADIOLOGY  Not indicated ____________________________________________  Procedures  ____________________________________________  23 year old female presenting to the emergency department after onset of abdominal pain and 3 episodes of vomiting with out fever or diarrhea.  Abdominal pain has since stopped and she is now able to tolerate water.  Patient states that he she has not attempted to eat anything today.  No vomiting since sometime in the middle of the night.  Plan to give Zofran ODT and then p.o. challenge.  ----------------------------------------- 7:25 PM on 07/13/2018 -----------------------------------------  Patient still tolerating ginger ale and saltine crackers.  She  states that she feels like she is ready to go home.  She denies abdominal pain or nausea.  She was encouraged to continue a bland diet for the next 12 hours or so then progress to normal diet as tolerated.  She is to follow-up with her primary care provider for symptoms of concern or return to the emergency department if unable to schedule an appointment.  INITIAL IMPRESSION / ASSESSMENT AND PLAN / ED COURSE  Pertinent labs & imaging results that were available during my care of the patient were reviewed by me and considered in my medical decision making (see chart for details).  ____________________________________________   FINAL CLINICAL IMPRESSION(S) / ED DIAGNOSES  Final diagnoses:  Intractable vomiting with nausea, unspecified vomiting type    Note:  This document was prepared using Dragon voice recognition software and may include unintentional dictation errors.   Chinita Pester, FNP 07/13/18 1926    Sharman Cheek, MD 07/13/18 2322

## 2018-11-11 ENCOUNTER — Emergency Department
Admission: EM | Admit: 2018-11-11 | Discharge: 2018-11-11 | Disposition: A | Discharge status: Home / Self Care | Payer: Medicaid Other | Attending: Emergency Medicine | Admitting: Emergency Medicine

## 2018-11-11 ENCOUNTER — Other Ambulatory Visit: Payer: Self-pay

## 2018-11-11 DIAGNOSIS — Z79899 Other long term (current) drug therapy: Secondary | ICD-10-CM | POA: Insufficient documentation

## 2018-11-11 DIAGNOSIS — Y939 Activity, unspecified: Secondary | ICD-10-CM | POA: Diagnosis not present

## 2018-11-11 DIAGNOSIS — S3992XA Unspecified injury of lower back, initial encounter: Secondary | ICD-10-CM | POA: Diagnosis present

## 2018-11-11 DIAGNOSIS — S39012A Strain of muscle, fascia and tendon of lower back, initial encounter: Secondary | ICD-10-CM | POA: Insufficient documentation

## 2018-11-11 DIAGNOSIS — Z0279 Encounter for issue of other medical certificate: Secondary | ICD-10-CM | POA: Diagnosis not present

## 2018-11-11 DIAGNOSIS — Y999 Unspecified external cause status: Secondary | ICD-10-CM | POA: Diagnosis not present

## 2018-11-11 DIAGNOSIS — Y929 Unspecified place or not applicable: Secondary | ICD-10-CM | POA: Diagnosis not present

## 2018-11-11 DIAGNOSIS — X58XXXA Exposure to other specified factors, initial encounter: Secondary | ICD-10-CM | POA: Insufficient documentation

## 2018-11-11 NOTE — ED Triage Notes (Signed)
Lower back pain without injury X 1 week. Recently started job where she is on her feet a lot. Needs a work note. Pt alert and oriented X4, cooperative, RR even and unlabored, color WNL. Pt in NAD.

## 2018-11-11 NOTE — ED Provider Notes (Signed)
[facility] Emergency Department Provider Note  ____________________________________________   First MD Initiated Contact with Patient 11/11/18 1234     (approximate)  I have reviewed the triage vital signs and the nursing notes.   HISTORY  Chief Complaint Back Pain    HPI KATILIN RAYNES is a 23 y.o. female presents to the ED with C/o low back pain for 2 day, not known injury, pain is worse with movement, increased with bending over, denies numbness, tingling, or changes in bowel/urinary habits,  Using otc meds with mild relief.  Denies UTI symptoms or vaginal discharge.  Patient states she really just needs a work note.  She would like me to backdate it for yesterday. Remainder ros neg   Past Medical History:  Diagnosis Date  . ADHD   . Depression     Patient Active Problem List   Diagnosis Date Noted  . Labor and delivery indication for care or intervention 07/02/2017  . PIH (pregnancy induced hypertension) 07/02/2017  . High-risk pregnancy 07/02/2017    Past Surgical History:  Procedure Laterality Date  . NO PAST SURGERIES      Prior to Admission medications   Medication Sig Start Date End Date Taking? Authorizing Provider  ferrous sulfate 325 (65 FE) MG EC tablet Take 325 mg by mouth 3 (three) times daily with meals.    [provider]  ibuprofen (ADVIL,MOTRIN) 600 MG tablet Take 1 tablet (600 mg total) by mouth every 6 (six) hours. 07/03/17   McVey, Murray Hodgkins, CNM  lisdexamfetamine (VYVANSE) 40 MG capsule Take 40 mg by mouth daily.    [provider]    Allergies Patient has no known allergies.  Family History  Problem Relation Age of Onset  . Kidney failure Father     Social History Social History   Tobacco Use  . Smoking status: Never Smoker  . Smokeless tobacco: Never Used  Substance Use Topics  . Alcohol use: No    Alcohol/week: 0.0 standard drinks  . Drug use: No    Review of Systems   Constitutional: No fever/chills Eyes: No visual changes. ENT: No sore throat. Respiratory: Denies cough Genitourinary: Negative for dysuria. Musculoskeletal: Positive for back pain. Skin: Negative for rash.    ____________________________________________   PHYSICAL EXAM:  VITAL SIGNS: ED Triage Vitals [11/11/18 1217]  Enc Vitals Group     BP 137/87     Pulse Rate 76     Resp 16     Temp 99.3 F (37.4 C)     Temp Source Oral     SpO2 100 %     Weight 109 lb (49.4 kg)     Height 5\' 3"  (1.6 m)     Head Circumference      Peak Flow      Pain Score 7     Pain Loc      Pain Edu?      Excl. in [place]?     Constitutional: Alert and oriented. Well appearing and in no acute distress. Eyes: Conjunctivae are normal.  Head: Atraumatic. Nose: No congestion/rhinnorhea. Mouth/Throat: Mucous membranes are moist.   Neck:  supple no lymphadenopathy noted Cardiovascular: Normal rate, regular rhythm.  Respiratory: Normal respiratory effort.  No retractions,  GU: deferred Musculoskeletal: FROM all extremities, warm and well perfused.  Full rom of back, lumbar spine nontender, negative slr, 5/5 strength in great toes b/l, 5/5 strength in lower legs, n/v intact Neurologic:  Normal speech and language.  Skin:  Skin is warm, dry and intact. No rash noted. Psychiatric: Mood and affect are normal. Speech and behavior are normal.  ____________________________________________   LABS (all labs ordered are listed, but only abnormal results are displayed)  Labs Reviewed - No data to display ____________________________________________   ____________________________________________  RADIOLOGY    ____________________________________________   PROCEDURES  Procedure(s) performed: No  Procedures    ____________________________________________   INITIAL IMPRESSION / ASSESSMENT AND PLAN / ED COURSE  Pertinent labs & imaging results that were available during my care of the  patient were reviewed by me and considered in my medical decision making (see chart for details).   Patient is a 23 year old female presents emergency department complaining of back pain.  Basically she called out of work yesterday and was supposed to go to the doctor but did not need to work note.  She denies any numbness or tingling.  No known injury.  She took ibuprofen with relief.  Physical exam is completely normal.  Explained findings to the patient.  She is given a work note stating she can go back to work today.  She is to continue take ibuprofen if needed.  Follow-up with her regular doctor if not better in 3 to 5 days.  Return if worsening.     As part of my medical decision making, I reviewed the following data within the electronic MEDICAL RECORD NUMBER Nursing notes reviewed and incorporated, Old chart reviewed, Notes from prior ED visits and [place] Controlled Substance Database  ____________________________________________   FINAL CLINICAL IMPRESSION(S) / ED DIAGNOSES  Final diagnoses:  Lumbar strain, initial encounter      NEW MEDICATIONS STARTED DURING THIS VISIT:  New Prescriptions   No medications on file     Note:  This document was prepared using Dragon voice recognition software and may include unintentional dictation errors.     Faythe GheeFisher, Susan W, PA-C 11/11/18 1246    Shaune PollackIsaacs, Cameron, MD 11/13/18 [phone]

## 2018-11-11 NOTE — ED Notes (Signed)
See triage note  Presents with lower back pain  States she has a hx of back pain since she was a child  But her back is hurting more  Esp since she has been working   Denies any injury  States IBU helps with the pain

## 2018-11-11 NOTE — Discharge Instructions (Signed)
Try to have good posture.  Use supportive shoes while walking and standing for long periods.  Take ibuprofen for pain as needed.  Apply ice to the lower back.  Return if worsening.

## 2020-02-23 ENCOUNTER — Other Ambulatory Visit: Payer: Self-pay

## 2020-02-23 ENCOUNTER — Emergency Department
Admission: EM | Admit: 2020-02-23 | Discharge: 2020-02-23 | Disposition: A | Discharge status: Home / Self Care | Payer: Medicaid Other | Attending: Emergency Medicine | Admitting: Emergency Medicine

## 2020-02-23 DIAGNOSIS — J029 Acute pharyngitis, unspecified: Secondary | ICD-10-CM | POA: Diagnosis present

## 2020-02-23 DIAGNOSIS — R519 Headache, unspecified: Secondary | ICD-10-CM | POA: Insufficient documentation

## 2020-02-23 DIAGNOSIS — Z20822 Contact with and (suspected) exposure to covid-19: Secondary | ICD-10-CM | POA: Diagnosis not present

## 2020-02-23 LAB — RESP PANEL BY RT-PCR (FLU A&B, COVID) ARPGX2
Influenza A by PCR: NEGATIVE
Influenza B by PCR: NEGATIVE
SARS Coronavirus 2 by RT PCR: NEGATIVE

## 2020-02-23 NOTE — ED Triage Notes (Signed)
Pt presents via POV c/o sore throat, headache, and body aches x3 days. Reports + Covid exposure at work.

## 2020-02-23 NOTE — Discharge Instructions (Signed)
Follow-up with your regular doctor if not improved in 3 days.  If worsening please return emergency department for additional Covid testing. Covid test negative today.  May return to work for your next scheduled shift

## 2020-02-23 NOTE — ED Provider Notes (Signed)
[facility] Emergency Department Provider Note  ____________________________________________   Event Date/Time   First MD Initiated Contact with Patient 02/23/20 1948     (approximate)  I have reviewed the triage vital signs and the nursing notes.   HISTORY  Chief Complaint Headache    HPI Brittany Estrada is a 24 y.o. female presents emergency department complaint of sore throat, headache and body aches for 3 days.  Patient had a positive Covid exposure at work.  She denies fever at this time.  No vomiting or diarrhea    Past Medical History:  Diagnosis Date   ADHD    Depression     Patient Active Problem List   Diagnosis Date Noted   Labor and delivery indication for care or intervention 07/02/2017   PIH (pregnancy induced hypertension) 07/02/2017   High-risk pregnancy 07/02/2017    Past Surgical History:  Procedure Laterality Date   NO PAST SURGERIES      Prior to Admission medications   Medication Sig Start Date End Date Taking? Authorizing Provider  ferrous sulfate 325 (65 FE) MG EC tablet Take 325 mg by mouth 3 (three) times daily with meals.    [provider]  ibuprofen (ADVIL,MOTRIN) 600 MG tablet Take 1 tablet (600 mg total) by mouth every 6 (six) hours. 07/03/17   McVey, Prudencio Pair, CNM  lisdexamfetamine (VYVANSE) 40 MG capsule Take 40 mg by mouth daily.    [provider]    Allergies Patient has no known allergies.  Family History  Problem Relation Age of Onset   Kidney failure Father     Social History Social History   Tobacco Use   Smoking status: Never Smoker   Smokeless tobacco: Never Used  Substance Use Topics   Alcohol use: No    Alcohol/week: 0.0 standard drinks   Drug use: No    Review of Systems  Constitutional: No fever/chills Eyes: No visual changes. ENT: Positive sore throat. Respiratory: Denies cough Cardiovascular: Denies chest pain Gastrointestinal: Denies  abdominal pain Genitourinary: Negative for dysuria. Musculoskeletal: Negative for back pain. Skin: Negative for rash. Psychiatric: no mood changes,     ____________________________________________   PHYSICAL EXAM:  VITAL SIGNS: ED Triage Vitals  Enc Vitals Group     BP 02/23/20 1819 (!) 136/91     Pulse Rate 02/23/20 1819 77     Resp 02/23/20 1819 16     Temp 02/23/20 1819 98.8 F (37.1 C)     Temp Source 02/23/20 1819 Oral     SpO2 02/23/20 1819 100 %     Weight 02/23/20 1819 111 lb (50.3 kg)     Height 02/23/20 1819 5\' 3"  (1.6 m)     Head Circumference --      Peak Flow --      Pain Score 02/23/20 1831 6     Pain Loc --      Pain Edu? --      Excl. in GC? --     Constitutional: Alert and oriented. Well appearing and in no acute distress. Eyes: Conjunctivae are normal.  Head: Atraumatic. Nose: No congestion/rhinnorhea. Mouth/Throat: Mucous membranes are moist.  Throat appears normal Neck:  supple no lymphadenopathy noted Cardiovascular: Normal rate, regular rhythm. Heart sounds are normal Respiratory: Normal respiratory effort.  No retractions, lungs c t a  GU: deferred Musculoskeletal: FROM all extremities, warm and well perfused Neurologic:  Normal speech and language.  Skin:  Skin is warm, dry and intact. No rash  noted. Psychiatric: Mood and affect are normal. Speech and behavior are normal.  ____________________________________________   LABS (all labs ordered are listed, but only abnormal results are displayed)  Labs Reviewed  RESP PANEL BY RT-PCR (FLU A&B, COVID) [id number]   ____________________________________________   ____________________________________________  RADIOLOGY    ____________________________________________   PROCEDURES  Procedure(s) performed: No  Procedures    ____________________________________________   INITIAL IMPRESSION / ASSESSMENT AND PLAN / ED COURSE  Pertinent labs & imaging results that were available  during my care of the patient were reviewed by me and considered in my medical decision making (see chart for details).   Patient is a 24 year old female presents with Covid-like symptoms and needs Covid test to return to work.  See HPI.  Physical exam is unremarkable  Covid test is negative, flu test negative  I did explain the findings to the patient.  She was given a work note.  She is to return if worsening.  Cautioned to return for additional testing if she starts to have a fever or feels worse.  She states she understands.  She discharged stable condition.     Brittany Estrada was evaluated in Emergency Department on 02/23/2020 for the symptoms described in the history of present illness. She was evaluated in the context of the global COVID-19 pandemic, which necessitated consideration that the patient might be at risk for infection with the SARS-CoV-2 virus that causes COVID-19. Institutional protocols and algorithms that pertain to the evaluation of patients at risk for COVID-19 are in a state of rapid change based on information released by regulatory bodies including the CDC and federal and state organizations. These policies and algorithms were followed during the patient's care in the ED.    As part of my medical decision making, I reviewed the following data within the electronic MEDICAL RECORD NUMBER Nursing notes reviewed and incorporated, Labs reviewed , Old chart reviewed, Notes from prior ED visits and  Controlled Substance Database  ____________________________________________   FINAL CLINICAL IMPRESSION(S) / ED DIAGNOSES  Final diagnoses:  Contact with and (suspected) exposure to covid-19      NEW MEDICATIONS STARTED DURING THIS VISIT:  New Prescriptions   No medications on file     Note:  This document was prepared using Dragon voice recognition software and may include unintentional dictation errors.    Faythe Ghee, PA-C 02/23/20 2009    Chesley Noon, MD 02/24/20 2328

## 2021-05-31 ENCOUNTER — Emergency Department
Admission: EM | Admit: 2021-05-31 | Discharge: 2021-05-31 | Disposition: A | Discharge status: Home / Self Care | Payer: Medicaid Other | Attending: Emergency Medicine | Admitting: Emergency Medicine

## 2021-05-31 ENCOUNTER — Other Ambulatory Visit: Payer: Self-pay

## 2021-05-31 DIAGNOSIS — R519 Headache, unspecified: Secondary | ICD-10-CM | POA: Insufficient documentation

## 2021-05-31 MED ORDER — DIPHENHYDRAMINE HCL 25 MG PO CAPS
50.0000 mg | ORAL_CAPSULE | Freq: Once | ORAL | Status: AC
  Administered 2021-05-31: 50 mg via ORAL
  Filled 2021-05-31: qty 2

## 2021-05-31 MED ORDER — METOCLOPRAMIDE HCL 10 MG PO TABS
10.0000 mg | ORAL_TABLET | Freq: Once | ORAL | Status: AC
  Administered 2021-05-31: 10 mg via ORAL
  Filled 2021-05-31: qty 1

## 2021-05-31 MED ORDER — IBUPROFEN 800 MG PO TABS
800.0000 mg | ORAL_TABLET | Freq: Once | ORAL | Status: AC
  Administered 2021-05-31: 800 mg via ORAL
  Filled 2021-05-31: qty 1

## 2021-05-31 MED ORDER — METOCLOPRAMIDE HCL 10 MG PO TABS: 10.0000 mg | ORAL_TABLET | Freq: Three times a day (TID) | ORAL | 0 refills | Status: DC

## 2021-05-31 NOTE — Discharge Instructions (Signed)
Take the prescription anti-nausea medicine along with OTC Benadryl (25-50 mg), Tylenol [phone] mg), and Motrin (600-800 mg) for headache pain relief. You may take these medicines up to 3 times a day, as needed.  ?

## 2021-05-31 NOTE — ED Notes (Signed)
Dc instructions and scripts reviewed with pt no questions or concerns at this time.  

## 2021-05-31 NOTE — ED Triage Notes (Signed)
Pt states she has had a HA since last week- pt states the pain is behind both of her eyes- pt has tried OTC with no relief- pt states the pain is constant ?

## 2021-05-31 NOTE — ED Provider Notes (Signed)
? ? ?[facility] ?Emergency Department Provider Note ? ? ? ? Event Date/Time  ? First MD Initiated Contact with Patient 05/31/21 1437   ?  (approximate) ? ? ?History  ? ?Headache ? ? ?HPI ? ?Brittany Estrada is a 26 y.o. female with a history of depression and ADHD, presents to the ED for evaluation of intermittent headaches since last week.  She denies any vision disturbance, light sensitivity, nausea, vomiting, or dizziness.  She reports limited benefit with attempts to take intermittent sporadic doses of Tylenol over-the-counter.  Chart review reveals the patient had a visit for the same complaint in February.  ? ? ?Physical Exam  ? ?Triage Vital Signs: ?ED Triage Vitals  ?Enc Vitals Group  ?   BP 05/31/21 1417 111/72  ?   Pulse Rate 05/31/21 1417 82  ?   Resp 05/31/21 1417 16  ?   Temp 05/31/21 1417 98.4 ?F (36.9 ?C)  ?   Temp Source 05/31/21 1417 Oral  ?   SpO2 05/31/21 1417 96 %  ?   Weight 05/31/21 1418 111 lb (50.3 kg)  ?   Height 05/31/21 1418 5\' 3"  (1.6 m)  ?   Head Circumference --   ?   Peak Flow --   ?   Pain Score 05/31/21 1421 9  ?   Pain Loc --   ?   Pain Edu? --   ?   Excl. in GC? --   ? ? ?Most recent vital signs: ?Vitals:  ? 05/31/21 1417 05/31/21 1611  ?BP: 111/72 115/78  ?Pulse: 82 84  ?Resp: 16 18  ?Temp: 98.4 ?F (36.9 ?C) 98.5 ?F (36.9 ?C)  ?SpO2: 96% 98%  ? ? ?General Awake, no distress.  ?HEENT NCAT. PERRL. EOMI. normal fundi bilaterally.  No rhinorrhea. Mucous membranes are moist.  EMs are clear bilaterally ?CV:  Good peripheral perfusion.  ?RESP:  Normal effort.  ?ABD:  No distention.  ? ? ?ED Results / Procedures / Treatments  ? ?Labs ?(all labs ordered are listed, but only abnormal results are displayed) ?Labs Reviewed - No data to display ? ? ?EKG ? ? ? ?RADIOLOGY ? ? ?No results found. ? ? ?PROCEDURES: ? ?Critical Care performed: No ? ?Procedures ? ? ?MEDICATIONS ORDERED IN ED: ?Medications  ?ibuprofen (ADVIL) tablet 800 mg (800 mg Oral Given 05/31/21 1528)   ?metoCLOPramide (REGLAN) tablet 10 mg (10 mg Oral Given 05/31/21 1528)  ?diphenhydrAMINE (BENADRYL) capsule 50 mg (50 mg Oral Given 05/31/21 1528)  ? ? ? ?IMPRESSION / MDM / ASSESSMENT AND PLAN / ED COURSE  ?I reviewed the triage vital signs and the nursing notes. ?             ?               ? ?Differential diagnosis includes, but is not limited to, cluster headache, sinusitis, AOM ? ?Patient to the ED for evaluation of intermittent headache pain for the last week.  Patient denies any interim fevers, chills, nausea, vomiting, or visual loss.  Patient denies any benefit with OTC medications taken sporadically.  Patient was treated in the ED following evaluation with a reassuring exam.  No red flags noted on exam.  Patient's diagnosis is consistent with generalized headache. Patient will be discharged home with prescriptions for Reglan, which she will take with OTC Benadryl, Tylenol, and Motrin. Patient is to follow up with her primary provider as needed or otherwise directed. Patient is given ED precautions  to return to the ED for any worsening or new symptoms. ? ? ? ?FINAL CLINICAL IMPRESSION(S) / ED DIAGNOSES  ? ?Final diagnoses:  ?Acute nonintractable headache, unspecified headache type  ? ? ? ?Rx / DC Orders  ? ?ED Discharge Orders   ? ?      Ordered  ?  metoCLOPramide (REGLAN) 10 MG tablet  3 times daily with meals       ? 05/31/21 1608  ? ?  ?  ? ?  ? ? ? ?Note:  This document was prepared using Dragon voice recognition software and may include unintentional dictation errors. ? ?  ?Lissa Hoard, PA-C ?05/31/21 1952 ? ?  ?Sharyn Creamer, MD ?06/02/21 2033 ? ?

## 2021-09-17 ENCOUNTER — Other Ambulatory Visit: Payer: Self-pay | Admitting: Family Medicine

## 2021-09-17 DIAGNOSIS — Z3481 Encounter for supervision of other normal pregnancy, first trimester: Secondary | ICD-10-CM

## 2021-09-20 ENCOUNTER — Telehealth: Payer: Medicaid Other | Admitting: Nurse Practitioner

## 2021-09-20 DIAGNOSIS — R079 Chest pain, unspecified: Secondary | ICD-10-CM

## 2021-09-20 NOTE — Progress Notes (Signed)
Virtual Visit Consent   Brittany Estrada, you are scheduled for a virtual visit with Brittany Daphine Deutscher, FNP, a [facility] provider, today.     Just as with appointments in the office, your consent must be obtained to participate.  Your consent will be active for this visit and any virtual visit you may have with one of our providers in the next 365 days.     If you have a MyChart account, a copy of this consent can be sent to you electronically.  All virtual visits are billed to your insurance company just like a traditional visit in the office.    As this is a virtual visit, video technology does not allow for your provider to perform a traditional examination.  This may limit your provider's ability to fully assess your condition.  If your provider identifies any concerns that need to be evaluated in person or the need to arrange testing (such as labs, EKG, etc.), we will make arrangements to do so.     Although advances in technology are sophisticated, we cannot ensure that it will always work on either your end or our end.  If the connection with a video visit is poor, the visit may have to be switched to a telephone visit.  With either a video or telephone visit, we are not always able to ensure that we have a secure connection.     I need to obtain your verbal consent now.   Are you willing to proceed with your visit today? YES   [name] has provided verbal consent on 09/20/2021 for a virtual visit (video or telephone).   Brittany Daphine Deutscher, FNP   Date: 09/20/2021 6:24 PM   Virtual Visit via Video Note   I, Brittany Estrada, connected with Brittany Estrada ([id number], 1995-10-15) on 09/20/21 at  7:15 PM EDT by a video-enabled telemedicine application and verified that I am speaking with the correct person using two identifiers.  Location: Patient: Virtual Visit Location Patient: Home Provider: Virtual Visit Location Provider: Mobile   I discussed the  limitations of evaluation and management by telemedicine and the availability of in person appointments. The patient expressed understanding and agreed to proceed.    History of Present Illness: Brittany Estrada is a 26 y.o. who identifies as a female who was assigned female at birth, and is being seen today for chest pain.  HPI: Patient states she is [redacted] weeks pregnant and she started experiencing chest pain yesterday. Only happens when she is bending over or rubbing her chest. She denies any injury or pulled muscle. Ratespain 6/10. If she doe snot touch it or is resting she has no pain. She has not taken any meds for this.    Review of Systems  Constitutional:  Negative for diaphoresis and weight loss.  Eyes:  Negative for blurred vision, double vision and pain.  Respiratory:  Negative for shortness of breath.   Cardiovascular:  Positive for chest pain. Negative for palpitations, orthopnea and leg swelling.  Gastrointestinal:  Negative for abdominal pain.  Skin:  Negative for rash.  Neurological:  Negative for dizziness, sensory change, loss of consciousness, weakness and headaches.  Endo/Heme/Allergies:  Negative for polydipsia. Does not bruise/bleed easily.  Psychiatric/Behavioral:  Negative for memory loss. The patient does not have insomnia.   All other systems reviewed and are negative.   Problems:  Patient Active Problem List   Diagnosis Date Noted   Labor and delivery indication for care or  intervention 07/02/2017   PIH (pregnancy induced hypertension) 07/02/2017   High-risk pregnancy 07/02/2017    Allergies: No Known Allergies Medications:  Current Outpatient Medications:    ibuprofen (ADVIL,MOTRIN) 600 MG tablet, Take 1 tablet (600 mg total) by mouth every 6 (six) hours., Disp: 60 tablet, Rfl: 0   metoCLOPramide (REGLAN) 10 MG tablet, Take 1 tablet (10 mg total) by mouth 3 (three) times daily with meals for 5 days., Disp: 15 tablet, Rfl: 0   Norgestimate-Ethinyl Estradiol  Triphasic (TRI-SPRINTEC) 0.18/0.215/0.25 MG-35 MCG tablet, Take 1 tablet by mouth daily., Disp: , Rfl:   Observations/Objective: Patient is well-developed, well-nourished in no acute distress.  Resting comfortably  at home.  Head is normocephalic, atraumatic.  No labored breathing.  Speech is clear and coherent with logical content.  Patient is alert and oriented at baseline.    Assessment and Plan:  Brittany Estrada in today with chief complaint of Chest Pain   1. Chest pain, unspecified type Probable costochondritis- sonce sheis [redacted] weeks pregnant , I would suggest not taking anything if she can help it. If she needs something I would prefer tylenol OTC.  Rest. Can try tums if she thinks it is heart burn. If not improving see her OB.     Follow Up Instructions: I discussed the assessment and treatment plan with the patient. The patient was provided an opportunity to ask questions and all were answered. The patient agreed with the plan and demonstrated an understanding of the instructions.  A copy of instructions were sent to the patient via MyChart.  The patient was advised to call back or seek an in-person evaluation if the symptoms worsen or if the condition fails to improve as anticipated.  Time:  I spent 7 minutes with the patient via telehealth technology discussing the above problems/concerns.    Brittany Daphine Deutscher, FNP

## 2021-09-21 ENCOUNTER — Emergency Department: Payer: Medicaid Other

## 2021-09-21 ENCOUNTER — Other Ambulatory Visit: Payer: Self-pay

## 2021-09-21 ENCOUNTER — Encounter: Payer: Self-pay | Admitting: Emergency Medicine

## 2021-09-21 DIAGNOSIS — Z3A01 Less than 8 weeks gestation of pregnancy: Secondary | ICD-10-CM | POA: Insufficient documentation

## 2021-09-21 DIAGNOSIS — R0789 Other chest pain: Secondary | ICD-10-CM | POA: Diagnosis not present

## 2021-09-21 DIAGNOSIS — R000 Tachycardia, unspecified: Secondary | ICD-10-CM | POA: Insufficient documentation

## 2021-09-21 DIAGNOSIS — O26891 Other specified pregnancy related conditions, first trimester: Secondary | ICD-10-CM | POA: Insufficient documentation

## 2021-09-21 LAB — BASIC METABOLIC PANEL
Anion gap: 8 (ref 5–15)
BUN: 14 mg/dL (ref 6–20)
CO2: 25 mmol/L (ref 22–32)
Calcium: 9.5 mg/dL (ref 8.9–10.3)
Chloride: 104 mmol/L (ref 98–111)
Creatinine, Ser: 0.92 mg/dL (ref 0.44–1.00)
GFR, Estimated: >60 mL/min (ref >60)
Glucose, Bld: 107 mg/dL — ABNORMAL HIGH (ref 70–99)
Potassium: 3.8 mmol/L (ref 3.5–5.1)
Sodium: 137 mmol/L (ref 135–145)

## 2021-09-21 LAB — CBC
HCT: 36 % (ref 36.0–46.0)
Hemoglobin: 12 g/dL (ref 12.0–15.0)
MCH: 27.9 pg (ref 26.0–34.0)
MCHC: 33.3 g/dL (ref 30.0–36.0)
MCV: 83.7 fL (ref 80.0–100.0)
Platelets: 345 10*3/uL (ref 150–400)
RBC: 4.3 MIL/uL (ref 3.87–5.11)
RDW: 11.7 % (ref 11.5–15.5)
WBC: 7.6 10*3/uL (ref 4.0–10.5)
nRBC: 0 % (ref 0.0–0.2)

## 2021-09-21 LAB — POC URINE PREG, ED: Preg Test, Ur: POSITIVE — AB

## 2021-09-21 LAB — TROPONIN I (HIGH SENSITIVITY): Troponin I (High Sensitivity): <2 ng/L (ref <18)

## 2021-09-21 NOTE — ED Notes (Signed)
Rainbow sent to the lab at this time.  

## 2021-09-21 NOTE — ED Triage Notes (Signed)
Patient reports she is five weeks pregnant and has had chest pain x 3 days. States she took tylenol around 51 and Rolaids around 2100 with mild decrease in pain. States pain is worse when lying flat. Denies shortness of breath or dizziness.

## 2021-09-22 ENCOUNTER — Emergency Department
Admission: EM | Admit: 2021-09-22 | Discharge: 2021-09-22 | Disposition: A | Discharge status: Home / Self Care | Payer: Medicaid Other | Attending: Emergency Medicine | Admitting: Emergency Medicine

## 2021-09-22 DIAGNOSIS — Z3491 Encounter for supervision of normal pregnancy, unspecified, first trimester: Secondary | ICD-10-CM

## 2021-09-22 DIAGNOSIS — R079 Chest pain, unspecified: Secondary | ICD-10-CM

## 2021-09-22 LAB — TROPONIN I (HIGH SENSITIVITY): Troponin I (High Sensitivity): <2 ng/L

## 2021-09-22 MED ORDER — FAMOTIDINE 20 MG PO TABS: 20.0000 mg | ORAL_TABLET | Freq: Two times a day (BID) | ORAL | 0 refills | Status: AC

## 2021-09-22 MED ORDER — FAMOTIDINE 20 MG PO TABS
20.0000 mg | ORAL_TABLET | Freq: Once | ORAL | Status: AC
  Administered 2021-09-22: 20 mg via ORAL
  Filled 2021-09-22: qty 1

## 2021-09-22 MED ORDER — METOCLOPRAMIDE HCL 10 MG PO TABS: 10.0000 mg | ORAL_TABLET | Freq: Four times a day (QID) | ORAL | 0 refills | Status: DC | PRN

## 2021-09-22 MED ORDER — METOCLOPRAMIDE HCL 10 MG PO TABS
10.0000 mg | ORAL_TABLET | Freq: Once | ORAL | Status: AC
  Administered 2021-09-22: 10 mg via ORAL
  Filled 2021-09-22: qty 1

## 2021-09-22 NOTE — ED Provider Notes (Signed)
[facility] Provider Note    Event Date/Time   First MD Initiated Contact with Patient 09/22/21 0154     (approximate)   History   Chief Complaint: Chest Pain   HPI  KAEDYNCE TAPP is a 26 y.o. female with no significant past medical history who comes ED complaining of central chest pain for the past 3 days.  She has been taking Rolaids which does help, but the effect is short-lived and she is wondering if there is anything else she can take for heartburn.  It is worse laying flat.  No shortness of breath, not pleuritic, not exertional.  No cough or fever.  She does report that the front of her chest is tender to the touch with palpation and this does reproduce her symptoms as well.  She is also [redacted] weeks pregnant, she has a first prenatal visit scheduled for August 22.  She is taking prenatal vitamins.  Denies any concerns with pregnancy at this time.     Physical Exam   Triage Vital Signs: ED Triage Vitals  Enc Vitals Group     BP 09/21/21 2135 129/81     Pulse Rate 09/21/21 2135 83     Resp 09/21/21 2135 18     Temp 09/21/21 2135 99 F (37.2 C)     Temp Source 09/21/21 2135 Oral     SpO2 09/21/21 2135 99 %     Weight 09/21/21 2135 111 lb (50.3 kg)     Height 09/21/21 2135 5\' 4"  (1.626 m)     Head Circumference --      Peak Flow --      Pain Score 09/21/21 2150 6     Pain Loc --      Pain Edu? --      Excl. in GC? --     Most recent vital signs: Vitals:   09/22/21 0230 09/22/21 0231  BP: 120/82   Pulse: 77 77  Resp: 18   Temp:    SpO2: 100% 100%    General: Awake, no distress.  CV:  Good peripheral perfusion.  Regular rate and rhythm Resp:  Normal effort.  Clear to auscultation bilaterally Abd:  No distention.  Soft and nontender Other:  Moist oral mucosa.  No lower extremity edema or calf swelling or tenderness   ED Results / Procedures / Treatments   Labs (all labs ordered are listed, but only abnormal results are  displayed) Labs Reviewed  BASIC METABOLIC PANEL - Abnormal; Notable for the following components:      Result Value   Glucose, Bld 107 (*)    All other components within normal limits  POC URINE PREG, ED - Abnormal; Notable for the following components:   Preg Test, Ur POSITIVE (*)    All other components within normal limits  CBC  TROPONIN I (HIGH SENSITIVITY)  TROPONIN I (HIGH SENSITIVITY)     EKG Interpreted by me Sinus tachycardia rate 105.  Normal axis intervals QRS ST segments and T waves.   RADIOLOGY Chest x-ray interpreted by me, appears normal.  Radiology report reviewed   PROCEDURES:  Procedures   MEDICATIONS ORDERED IN ED: Medications  famotidine (PEPCID) tablet 20 mg (20 mg Oral Given 09/22/21 0228)  metoCLOPramide (REGLAN) tablet 10 mg (10 mg Oral Given 09/22/21 0228)     IMPRESSION / MDM / ASSESSMENT AND PLAN / ED COURSE  I reviewed the triage vital signs and the nursing notes.  Differential diagnosis includes, but is not limited to, GERD, chest wall pain, pneumothorax  Patient's presentation is most consistent with acute presentation with potential threat to life or bodily function.  Patient presents with nonspecific chest pain, most likely GERD.  Also likely some chest wall pain related to early pregnancy physiologic changes.  Recommend heat therapy as well as antacids. Considering the patient's symptoms, medical history, and physical examination today, I have low suspicion for ACS, PE, TAD, pneumothorax, carditis, mediastinitis, pneumonia, CHF, or sepsis. Exam and work-up are reassuring.       FINAL CLINICAL IMPRESSION(S) / ED DIAGNOSES   Final diagnoses:  Nonspecific chest pain  First trimester pregnancy     Rx / DC Orders   ED Discharge Orders          Ordered    famotidine (PEPCID) 20 MG tablet  2 times daily        09/22/21 0239    metoCLOPramide (REGLAN) 10 MG tablet  Every 6 hours PRN        09/22/21  0239             Note:  This document was prepared using Dragon voice recognition software and may include unintentional dictation errors.   Sharman Cheek, MD 09/22/21 [phone]

## 2021-09-22 NOTE — ED Notes (Signed)
Patient verbalizes understanding of discharge instructions. Opportunity for questioning and answers were provided. Armband removed by staff, pt discharged from ED to home via POV  

## 2021-09-24 ENCOUNTER — Ambulatory Visit
Admission: RE | Admit: 2021-09-24 | Discharge: 2021-09-24 | Disposition: A | Discharge status: Home / Self Care | Payer: Medicaid Other | Source: Ambulatory Visit | Attending: Family Medicine | Admitting: Family Medicine

## 2021-09-24 ENCOUNTER — Other Ambulatory Visit: Payer: Self-pay | Admitting: Family Medicine

## 2021-09-24 DIAGNOSIS — Z3A01 Less than 8 weeks gestation of pregnancy: Secondary | ICD-10-CM | POA: Insufficient documentation

## 2021-09-24 DIAGNOSIS — Z3687 Encounter for antenatal screening for uncertain dates: Secondary | ICD-10-CM | POA: Diagnosis present

## 2021-09-24 DIAGNOSIS — Z3481 Encounter for supervision of other normal pregnancy, first trimester: Secondary | ICD-10-CM

## 2021-10-21 LAB — OB RESULTS CONSOLE GC/CHLAMYDIA
Chlamydia: NEGATIVE
Neisseria Gonorrhea: NEGATIVE

## 2021-10-21 LAB — OB RESULTS CONSOLE VARICELLA ZOSTER ANTIBODY, IGG: Varicella: IMMUNE

## 2021-10-21 LAB — OB RESULTS CONSOLE RUBELLA ANTIBODY, IGM: Rubella: IMMUNE

## 2021-10-21 LAB — OB RESULTS CONSOLE HIV ANTIBODY (ROUTINE TESTING): HIV: NONREACTIVE

## 2021-10-21 LAB — OB RESULTS CONSOLE RPR: RPR: NONREACTIVE

## 2021-10-21 LAB — OB RESULTS CONSOLE HEPATITIS B SURFACE ANTIGEN: Hepatitis B Surface Ag: NEGATIVE

## 2021-11-20 ENCOUNTER — Other Ambulatory Visit: Payer: Self-pay | Admitting: Primary Care

## 2021-11-20 DIAGNOSIS — Z3482 Encounter for supervision of other normal pregnancy, second trimester: Secondary | ICD-10-CM

## 2021-12-08 ENCOUNTER — Telehealth: Payer: Medicaid Other | Admitting: Family

## 2021-12-08 DIAGNOSIS — J209 Acute bronchitis, unspecified: Secondary | ICD-10-CM | POA: Diagnosis not present

## 2021-12-08 DIAGNOSIS — Z3490 Encounter for supervision of normal pregnancy, unspecified, unspecified trimester: Secondary | ICD-10-CM

## 2021-12-08 NOTE — Progress Notes (Signed)
We are sorry that you are not feeling well.  Here is how we plan to help!  Based on your presentation I believe you most likely have A cough due to a virus.  This is called viral bronchitis and is best treated by rest, plenty of fluids and control of the cough.  You may use Ibuprofen or Tylenol as directed to help your symptoms.     In addition you may use A non-prescription cough medication called Robitussin DAC. Take 2 teaspoons every 8 hours or Delsym: take 2 teaspoons every 12 hours.    From your responses in the eVisit questionnaire you describe inflammation in the upper respiratory tract which is causing a significant cough.  This is commonly called Bronchitis and has four common causes:   Allergies Viral Infections Acid Reflux Bacterial Infection Allergies, viruses and acid reflux are treated by controlling symptoms or eliminating the cause. An example might be a cough caused by taking certain blood pressure medications. You stop the cough by changing the medication. Another example might be a cough caused by acid reflux. Controlling the reflux helps control the cough.  USE OF BRONCHODILATOR ("RESCUE") INHALERS: There is a risk from using your bronchodilator too frequently.  The risk is that over-reliance on a medication which only relaxes the muscles surrounding the breathing tubes can reduce the effectiveness of medications prescribed to reduce swelling and congestion of the tubes themselves.  Although you feel brief relief from the bronchodilator inhaler, your asthma may actually be worsening with the tubes becoming more swollen and filled with mucus.  This can delay other crucial treatments, such as oral steroid medications. If you need to use a bronchodilator inhaler daily, several times per day, you should discuss this with your provider.  There are probably better treatments that could be used to keep your asthma under control.     HOME CARE Only take medications as instructed by  your medical team. Complete the entire course of an antibiotic. Drink plenty of fluids and get plenty of rest. Avoid close contacts especially the very young and the elderly Cover your mouth if you cough or cough into your sleeve. Always remember to wash your hands A steam or ultrasonic humidifier can help congestion.   GET HELP RIGHT AWAY IF: You develop worsening fever. You become short of breath You cough up blood. Your symptoms persist after you have completed your treatment plan MAKE SURE YOU  Understand these instructions. Will watch your condition. Will get help right away if you are not doing well or get worse.    Thank you for choosing an e-visit.  Your e-visit answers were reviewed by a board certified advanced clinical practitioner to complete your personal care plan. Depending upon the condition, your plan could have included both over the counter or prescription medications.  Please review your pharmacy choice. Make sure the pharmacy is open so you can pick up prescription now. If there is a problem, you may contact your provider through [employer] and have the prescription routed to another pharmacy.  Your safety is important to [place]. If you have drug allergies check your prescription carefully.   For the next 24 hours you can use MyChart to ask questions about today's visit, request a non-urgent call back, or ask for a work or school excuse. You will get an email in the next two days asking about your experience. I hope that your e-visit has been valuable and will speed your recovery.  Approximately 5 minutes  was spent documenting and reviewing patient's chart.

## 2021-12-22 ENCOUNTER — Ambulatory Visit
Admission: RE | Admit: 2021-12-22 | Discharge: 2021-12-22 | Disposition: A | Discharge status: Home / Self Care | Payer: Medicaid Other | Source: Ambulatory Visit | Attending: Primary Care | Admitting: Primary Care

## 2021-12-22 DIAGNOSIS — Z3482 Encounter for supervision of other normal pregnancy, second trimester: Secondary | ICD-10-CM | POA: Insufficient documentation

## 2021-12-22 DIAGNOSIS — Z3689 Encounter for other specified antenatal screening: Secondary | ICD-10-CM | POA: Insufficient documentation

## 2021-12-22 DIAGNOSIS — Z3A17 17 weeks gestation of pregnancy: Secondary | ICD-10-CM | POA: Diagnosis not present

## 2022-02-10 ENCOUNTER — Other Ambulatory Visit: Payer: Self-pay | Admitting: Primary Care

## 2022-02-10 DIAGNOSIS — Z3482 Encounter for supervision of other normal pregnancy, second trimester: Secondary | ICD-10-CM

## 2022-02-20 ENCOUNTER — Ambulatory Visit
Admission: RE | Admit: 2022-02-20 | Discharge: 2022-02-20 | Disposition: A | Discharge status: Home / Self Care | Payer: Medicaid Other | Source: Ambulatory Visit | Attending: Primary Care | Admitting: Primary Care

## 2022-02-20 DIAGNOSIS — Z3A26 26 weeks gestation of pregnancy: Secondary | ICD-10-CM | POA: Diagnosis not present

## 2022-02-20 DIAGNOSIS — Z3482 Encounter for supervision of other normal pregnancy, second trimester: Secondary | ICD-10-CM | POA: Insufficient documentation

## 2022-03-02 NOTE — L&D Delivery Note (Signed)
Delivery Note  Date of delivery: 05/17/2022 Estimated Date of Delivery: 05/23/22 Patient's last menstrual period was 08/16/2021 (exact date). EGA: [redacted]w[redacted]d  Delivery Note At 8:54 AM a viable female was delivered via Vaginal, Spontaneous (Presentation: Occiput Anterior).  APGAR: 8, 9; weight pending.  Placenta status: Spontaneous, Intact.  Cord: 3 vessels with the following complications: None.    Anesthesia: None Episiotomy: None Lacerations: None Suture Repair:  n/a Est. Blood Loss (mL): 530  First Stage: Labor onset: 2200 Augmentation : none Analgesia /Anesthesia intrapartum: none SROM at home  [place] presented to L&D with active labor and SROM. She was expectantly managed.   Second Stage: Complete dilation at 0852 Onset of pushing at 0852 FHR second stage Cat I Delivery at 0854 on 05/17/2022  She progressed to complete and had a spontaneous vaginal birth of a live female over an intact perineum. The fetal head was delivered in OA position with restitution to LOA. 1 nuchal cord resolved with the summersault maneuver. Anterior then posterior shoulders delivered spontaneously. Baby placed on mom's abdomen and attended to by transition RN. Cord clamped and cut after 1+ mins by the midwife. Cord segment given to nurse for gases. Cord blood obtained for newborn labs.  Third Stage: Placenta delivered intact with 3VC at 0901  Placenta disposition: routine disposal  Uterine tone firm / bleeding min IV pitocin given for hemorrhage prophylaxis  Anesthesia: None Episiotomy: None Lacerations: None Suture Repair: n/a Est. Blood Loss (mL): 123456  Complications: none  Mom to postpartum.  Baby to Couplet care / Skin to Skin.  Newborn: Birth Weight: pending  Apgar Scores: 8, 9 Feeding planned: bottle feeding   Clydene Laming, CNM 05/17/2022 9:52 AM

## 2022-04-02 ENCOUNTER — Telehealth: Payer: Medicaid Other | Admitting: Physician Assistant

## 2022-04-02 DIAGNOSIS — R197 Diarrhea, unspecified: Secondary | ICD-10-CM | POA: Diagnosis not present

## 2022-04-03 NOTE — Progress Notes (Signed)
We are sorry that you are not feeling well.  Here is how we plan to help!  Based on what you have shared with me it looks like you have Acute Infectious Diarrhea.  Most cases of acute diarrhea are due to infections with virus and bacteria and are self-limited conditions lasting less than 14 days.  For your symptoms you may take Imodium 2 mg tablets that are over the counter at your local pharmacy. Take two tablet now and then one after each loose stool up to 6 a day.  Antibiotics are not needed for most people with diarrhea.  HOME CARE We recommend changing your diet to help with your symptoms for the next few days. Drink plenty of fluids that contain water salt and sugar. Sports drinks such as Gatorade may help.  You may try broths, soups, bananas, applesauce, soft breads, mashed potatoes or crackers.  You are considered infectious for as long as the diarrhea continues. Hand washing or use of alcohol based hand sanitizers is recommend. It is best to stay out of work or school until your symptoms stop.   GET HELP RIGHT AWAY If you have dark yellow colored urine or do not pass urine frequently you should drink more fluids.   If your symptoms worsen  If you feel like you are going to pass out (faint) You have a new problem  MAKE SURE YOU  Understand these instructions. Will watch your condition. Will get help right away if you are not doing well or get worse.  Thank you for choosing an e-visit.  Your e-visit answers were reviewed by a board certified advanced clinical practitioner to complete your personal care plan. Depending upon the condition, your plan could have included both over the counter or prescription medications.  Please review your pharmacy choice. Make sure the pharmacy is open so you can pick up prescription now. If there is a problem, you may contact your provider through MyChart messaging and have the prescription routed to another pharmacy.  Your safety is important to  us. If you have drug allergies check your prescription carefully.   For the next 24 hours you can use MyChart to ask questions about today's visit, request a non-urgent call back, or ask for a work or school excuse. You will get an email in the next two days asking about your experience. I hope that your e-visit has been valuable and will speed your recovery.  I have spent 5 minutes in review of e-visit questionnaire, review and updating patient chart, medical decision making and response to patient.   Mahum Betten M Keighan Amezcua, PA-C  

## 2022-04-22 ENCOUNTER — Encounter: Payer: Self-pay | Admitting: Obstetrics and Gynecology

## 2022-04-22 ENCOUNTER — Observation Stay
Admission: EM | Admit: 2022-04-22 | Discharge: 2022-04-23 | Disposition: A | Discharge status: Home / Self Care | Payer: Medicaid Other | Attending: Obstetrics and Gynecology | Admitting: Obstetrics and Gynecology

## 2022-04-22 ENCOUNTER — Telehealth: Payer: Medicaid Other | Admitting: Nurse Practitioner

## 2022-04-22 ENCOUNTER — Other Ambulatory Visit: Payer: Self-pay

## 2022-04-22 DIAGNOSIS — M545 Low back pain, unspecified: Secondary | ICD-10-CM | POA: Diagnosis not present

## 2022-04-22 DIAGNOSIS — O99891 Other specified diseases and conditions complicating pregnancy: Secondary | ICD-10-CM | POA: Diagnosis present

## 2022-04-22 DIAGNOSIS — Z79899 Other long term (current) drug therapy: Secondary | ICD-10-CM | POA: Insufficient documentation

## 2022-04-22 DIAGNOSIS — O99283 Endocrine, nutritional and metabolic diseases complicating pregnancy, third trimester: Secondary | ICD-10-CM | POA: Diagnosis not present

## 2022-04-22 DIAGNOSIS — O331 Maternal care for disproportion due to generally contracted pelvis: Secondary | ICD-10-CM | POA: Insufficient documentation

## 2022-04-22 DIAGNOSIS — Z3A35 35 weeks gestation of pregnancy: Secondary | ICD-10-CM | POA: Insufficient documentation

## 2022-04-22 DIAGNOSIS — E860 Dehydration: Secondary | ICD-10-CM | POA: Diagnosis not present

## 2022-04-22 MED ORDER — LACTATED RINGERS IV BOLUS
1000.0000 mL | Freq: Once | INTRAVENOUS | Status: AC
  Administered 2022-04-22: 1000 mL via INTRAVENOUS

## 2022-04-22 NOTE — OB Triage Note (Signed)
Pt arrives G2 P1 with back, side, and abdominal pain which started yesterday. She was referred here by her OB through a virtual visit. Pt states that baby has been moving, and denies leaking of fluid, or bleeding.

## 2022-04-22 NOTE — Progress Notes (Signed)
For the safety of you and your child, I recommend a face to face office visit with a health care provider.  Many mothers need to take medicines during their pregnancy and while nursing.  Almost all medicines pass into the breast milk in small quantities.  Most are generally considered safe for a mother to take but some medicines must be avoided.  After reviewing your E-Visit request, I recommend that you consult your OB/GYN or pediatrician for medical advice in relation to your condition and prescription medications while pregnant or breastfeeding.  NOTE:  There will be NO CHARGE for this eVisit  If you are having a true medical emergency please call 911.    For an urgent face to face visit, [place] has six urgent care centers for your convenience:     [facility] [place] [facility] [place] Get Driving Directions [id number] [place], [place] [address]    [facility]) Get Driving Directions [id number] [place], [place] [address]  [facility] [place] [facility]) Get Driving Directions [id number] [address] [place],  [place]  [address]  [facility] Get Driving Directions [id number] [place], [place], [place] [address]   [facility] Get Driving Directions  [id number] [address] [place], [place] [address]   [facility] at  Get Driving Directions [id number] [address] [place],  [address]  Your MyChart E-visit questionnaire answers were reviewed by a board certified advanced clinical practitioner to complete your personal care plan based on your specific symptoms.  Thank you for using e-Visits.

## 2022-04-22 NOTE — Progress Notes (Signed)
[place], There is not much that can be done over virtual visit for back pain during pregnancy. If the pain was acute and is 7/10 you need to be evaluated in person to rule out infection and check on the health of you and the baby.   Based on what you shared with me, I feel your condition warrants further evaluation as soon as possible at an Emergency department.    NOTE: There will be NO CHARGE for this eVisit   If you are having a true medical emergency please call 911.      Emergency [facility]  Get Driving Directions  [id number]  [address]  [place], [place] [address]  Open 24/7/365      [facility] Emergency Department at [place]  [id number]  [place], [place] [address]  Open 24/7/365    Emergency [facility]  Get Driving Directions  [id number]  [address], [place] [address]  Open 24/7/365      [facility] at [facility]  Get Driving Directions  [id number]  [address]  [place], [place] [address]  Open 24/7/365    [facility]  Emergency [place]  Get Driving Directions  [id number]  [place], [place] [address]  Open 24/7/365    [place]  Get Driving Directions  [id number] [address]  [place], [place] [address]  Open 24/7/365    [facility]  Emergency [facility]  Get Driving Directions  [id number]  [address]  [place],  [address]  Open 24/7/365

## 2022-04-23 DIAGNOSIS — O99283 Endocrine, nutritional and metabolic diseases complicating pregnancy, third trimester: Secondary | ICD-10-CM | POA: Diagnosis not present

## 2022-04-23 DIAGNOSIS — O99891 Other specified diseases and conditions complicating pregnancy: Secondary | ICD-10-CM | POA: Diagnosis present

## 2022-04-23 LAB — URINALYSIS, COMPLETE (UACMP) WITH MICROSCOPIC
Bacteria, UA: NONE SEEN
Bilirubin Urine: NEGATIVE
Glucose, UA: NEGATIVE mg/dL
Hgb urine dipstick: NEGATIVE
Ketones, ur: NEGATIVE mg/dL
Leukocytes,Ua: NEGATIVE
Nitrite: NEGATIVE
Protein, ur: NEGATIVE mg/dL
Specific Gravity, Urine: 1.024 (ref 1.005–1.030)
pH: 7 (ref 5.0–8.0)

## 2022-04-23 MED ORDER — ZOLPIDEM TARTRATE 5 MG PO TABS: 5.0000 mg | ORAL_TABLET | Freq: Every evening | ORAL | Status: DC | PRN

## 2022-04-23 MED ORDER — CALCIUM CARBONATE ANTACID 500 MG PO CHEW: 2.0000 | CHEWABLE_TABLET | ORAL | Status: DC | PRN

## 2022-04-23 MED ORDER — LACTATED RINGERS IV SOLN: 125.0000 mL/h | INTRAVENOUS | Status: DC

## 2022-04-23 MED ORDER — PRENATAL MULTIVITAMIN CH: 1.0000 | ORAL_TABLET | Freq: Every day | ORAL | Status: DC

## 2022-04-23 MED ORDER — ACETAMINOPHEN 325 MG PO TABS: 650.0000 mg | ORAL_TABLET | ORAL | Status: DC | PRN

## 2022-04-23 MED ORDER — DOCUSATE SODIUM 100 MG PO CAPS: 100.0000 mg | ORAL_CAPSULE | Freq: Every day | ORAL | Status: DC

## 2022-04-23 NOTE — Discharge Summary (Signed)
Brittany Estrada is a 27 y.o. female. She is at 56w5dgestation. Patient's last menstrual period was 08/16/2021 (exact date). Estimated Date of Delivery: 05/23/22  [facility]  Current pregnancy complicated by:  Low-risk pregnancy according to [facility]  Chief complaint: Side-pain, low back pain, occasional uterine contractions  S: Resting comfortably. Occasional CTX, no VB.no LOF,  Active fetal movement.  Denies: HA, visual changes, SOB, or RUQ/epigastric pain  Maternal Medical History:   Past Medical History:  Diagnosis Date   ADHD    Depression     Past Surgical History:  Procedure Laterality Date   NO PAST SURGERIES      No Known Allergies  Prior to Admission medications   Medication Sig Start Date End Date Taking? Authorizing Provider  Prenatal Vit-Fe Fumarate-FA (PRENATAL MULTIVITAMIN) TABS tablet Take 1 tablet by mouth daily at 12 noon.   Yes [provider]  famotidine (PEPCID) 20 MG tablet Take 1 tablet (20 mg total) by mouth 2 (two) times daily. 09/22/21   SCarrie Mew MD  ibuprofen (ADVIL,MOTRIN) 600 MG tablet Take 1 tablet (600 mg total) by mouth every 6 (six) hours. Patient not taking: Reported on 04/22/2022 07/03/17   McVey, RMurray Hodgkins CNM  metoCLOPramide (REGLAN) 10 MG tablet Take 1 tablet (10 mg total) by mouth every 6 (six) hours as needed. 09/22/21   SCarrie Mew MD  Norgestimate-Ethinyl Estradiol Triphasic (TRI-SPRINTEC) 0.18/0.215/0.25 MG-35 MCG tablet Take 1 tablet by mouth daily. Patient not taking: Reported on 04/22/2022    [provider]    Social History: She  reports that she has never smoked. She has never used smokeless tobacco. She reports that she does not drink alcohol and does not use drugs.  Family History: family history includes Kidney failure in her father.  no history of gyn cancers  Review of Systems: A full review of systems was performed and negative except as noted in the  HPI.     O:  BP 131/82 (BP Location: Right Arm)   Pulse 92   Temp 98.7 F (37.1 C) (Oral)   Resp 18   Ht 5' 4"$  (1.626 m)   Wt 64.9 kg   LMP 08/16/2021 (Exact Date)   BMI 24.55 kg/m  Results for orders placed or performed during the hospital encounter of 04/22/22 (from the past 48 hour(s))  Urinalysis, Complete w Microscopic -Urine, Clean Catch   Collection Time: 04/22/22 11:42 PM  Result Value Ref Range   Color, Urine YELLOW (A) YELLOW   APPearance HAZY (A) CLEAR   Specific Gravity, Urine 1.024 1.005 - 1.030   pH 7.0 5.0 - 8.0   Glucose, UA NEGATIVE NEGATIVE mg/dL   Hgb urine dipstick NEGATIVE NEGATIVE   Bilirubin Urine NEGATIVE NEGATIVE   Ketones, ur NEGATIVE NEGATIVE mg/dL   Protein, ur NEGATIVE NEGATIVE mg/dL   Nitrite NEGATIVE NEGATIVE   Leukocytes,Ua NEGATIVE NEGATIVE   RBC / HPF 0-5 0 - 5 RBC/hpf   WBC, UA 0-5 0 - 5 WBC/hpf   Bacteria, UA NONE SEEN NONE SEEN   Squamous Epithelial / HPF 0-5 0 - 5 /HPF   Mucus PRESENT    Amorphous Crystal PRESENT    Non Squamous Epithelial PRESENT (A) NONE SEEN     Constitutional: NAD, AAOx3  HE/ENT: extraocular movements grossly intact, moist mucous membranes CV: RRR PULM: nl respiratory effort, CTABL     Abd: gravid, non-tender, non-distended, soft      Ext: Non-tender, Nonedematous   Psych:  mood appropriate, speech normal Pelvic: deferred  Fetal  monitoring: Cat 1 Appropriate for GA Baseline: 145bpm Variability: moderate Accelerations: present x >2 Decelerations absent Time 25mns Contractions: initially every 3-482m with uterine irritability, spaced out to every 5-1068m no uterine irritability present  A/P: 26 91o. 35w10w5de for antenatal surveillance for uterine contractions and side/back pain  Principle Diagnosis:  Dehydration, Back pain in pregnancy, uterine contractions  Labor: not present. Uterine contractions spaced out and became less intense after 1L LR bolus. She reports she is not feeling the contractions  anymore. UTI: not present Vaginal discharge: not present Fetal Wellbeing: Reassuring Cat 1 tracing. Reactive NST  D/c home stable, precautions reviewed, follow-up as scheduled.    DaniGertie FeyM 04/23/2022 1:41 AM

## 2022-04-23 NOTE — Progress Notes (Signed)
Pt DC home with instructions to keep future appointments and to concentrate on hydrating the next 24 hours. Pt verbalized understanding of discharge instructions and voiced no further questions. Pt ambulatory with steady gait at time of discharge with NAD noted.

## 2022-05-05 LAB — OB RESULTS CONSOLE GBS: GBS: NEGATIVE

## 2022-05-09 ENCOUNTER — Other Ambulatory Visit: Payer: Self-pay

## 2022-05-09 ENCOUNTER — Emergency Department
Admission: EM | Admit: 2022-05-09 | Discharge: 2022-05-09 | Disposition: A | Discharge status: Home / Self Care | Payer: Medicaid Other | Attending: Emergency Medicine | Admitting: Emergency Medicine

## 2022-05-09 DIAGNOSIS — Z1152 Encounter for screening for COVID-19: Secondary | ICD-10-CM | POA: Insufficient documentation

## 2022-05-09 DIAGNOSIS — O99513 Diseases of the respiratory system complicating pregnancy, third trimester: Secondary | ICD-10-CM | POA: Insufficient documentation

## 2022-05-09 DIAGNOSIS — O99891 Other specified diseases and conditions complicating pregnancy: Secondary | ICD-10-CM | POA: Diagnosis present

## 2022-05-09 DIAGNOSIS — J069 Acute upper respiratory infection, unspecified: Secondary | ICD-10-CM | POA: Diagnosis not present

## 2022-05-09 LAB — RESP PANEL BY RT-PCR (RSV, FLU A&B, COVID)  RVPGX2
Influenza A by PCR: NEGATIVE
Influenza B by PCR: NEGATIVE
Resp Syncytial Virus by PCR: NEGATIVE
SARS Coronavirus 2 by RT PCR: NEGATIVE

## 2022-05-09 MED ORDER — ACETAMINOPHEN 325 MG PO TABS
650.0000 mg | ORAL_TABLET | Freq: Once | ORAL | Status: AC
  Administered 2022-05-09: 650 mg via ORAL
  Filled 2022-05-09: qty 2

## 2022-05-09 NOTE — Discharge Instructions (Signed)
Your flu, RSV, and COVID test was negative.  You likely have another viral URI.  Please return for any new, worsening, or change in symptoms or other concerns.  It was a pleasure caring for you.  Congratulations on your upcoming delivery!!

## 2022-05-09 NOTE — ED Provider Notes (Signed)
[facility] Provider Note    Event Date/Time   First MD Initiated Contact with Patient 05/09/22 1226     (approximate)   History   Fever, Cough, Headache, and Chills   HPI  Brittany Estrada is a 27 y.o. female G2, P1 currently [redacted] weeks pregnant who presents today for evaluation of cough, nasal congestion, fevers, chills for the past 2 to 3 days.  Patient gets her OB/GYN care at [facility].  She denies abdominal pain or pregnancy concerns.  No vaginal bleeding or discharge.  She has not taken her temperature.  She has not taken any medicine for her symptoms.  Patient Active Problem List   Diagnosis Date Noted   Back pain affecting pregnancy in third trimester 04/23/2022   Labor and delivery indication for care or intervention 07/02/2017   PIH (pregnancy induced hypertension) 07/02/2017   High-risk pregnancy 07/02/2017          Physical Exam   Triage Vital Signs: ED Triage Vitals  Enc Vitals Group     BP 05/09/22 1222 136/89     Pulse Rate 05/09/22 1222 97     Resp 05/09/22 1222 17     Temp 05/09/22 1222 97.8 F (36.6 C)     Temp src --      SpO2 05/09/22 1222 97 %     Weight 05/09/22 1216 141 lb 1.5 oz (64 kg)     Height 05/09/22 1216 '5\' 4"'$  (1.626 m)     Head Circumference --      Peak Flow --      Pain Score 05/09/22 1216 6     Pain Loc --      Pain Edu? --      Excl. in [place]? --     Most recent vital signs: Vitals:   05/09/22 1222  BP: 136/89  Pulse: 97  Resp: 17  Temp: 97.8 F (36.6 C)  SpO2: 97%    Physical Exam Vitals and nursing note reviewed.  Constitutional:      General: Awake and alert. No acute distress.    Appearance: Normal appearance. The patient is normal weight.  HENT:     Head: Normocephalic and atraumatic.     Mouth: Mucous membranes are moist. Uvula midline.  No tonsillar exudate.  No soft palate fluctuance.  No trismus.  No voice change.  No sublingual swelling.  No tender cervical lymphadenopathy.  No  nuchal rigidity Eyes:     General: PERRL. Normal EOMs        Right eye: No discharge.        Left eye: No discharge.     Conjunctiva/sclera: Conjunctivae normal.  Cardiovascular:     Rate and Rhythm: Normal rate and regular rhythm.     Pulses: Normal pulses.  Pulmonary:     Effort: Pulmonary effort is normal. No respiratory distress.     Breath sounds: Normal breath sounds.  Able to speak easily in complete sentences Abdominal:     Abdomen is gravid. There is no abdominal tenderness. No rebound or guarding. No distention. Musculoskeletal:        General: No swelling. Normal range of motion.     Cervical back: Normal range of motion and neck supple.  Skin:    General: Skin is warm and dry.     Capillary Refill: Capillary refill takes less than 2 seconds.     Findings: No rash.  Neurological:     Mental Status: The patient  is awake and alert.      ED Results / Procedures / Treatments   Labs (all labs ordered are listed, but only abnormal results are displayed) Labs Reviewed  RESP PANEL BY RT-PCR (RSV, FLU A&B, COVID)  [id number]     EKG     RADIOLOGY     PROCEDURES:  Critical Care performed:   Procedures   MEDICATIONS ORDERED IN ED: Medications  acetaminophen (TYLENOL) tablet 650 mg (650 mg Oral Given 05/09/22 1235)     IMPRESSION / MDM / ASSESSMENT AND PLAN / ED COURSE  I reviewed the triage vital signs and the nursing notes.   Differential diagnosis includes, but is not limited to, COVID, flu, RSV, other URI.  Patient is awake and alert, hemodynamically stable and afebrile.  She is nontoxic in appearance.  Her lungs are clear to auscultation bilaterally, no increased work of breathing, no productive cough, no fever to suggest pneumonia.  She has no pregnancy concerns today.  No vaginal bleeding or discharge, no abdominal pain.  Symptoms of cough and nasal congestion are consistent with URI.  We discussed symptomatic management and return precautions.  Also  discussed strict follow-up instructions with her OB/GYN.  Patient understands and agrees with plan.  She was discharged in stable condition.   Patient's presentation is most consistent with acute complicated illness / injury requiring diagnostic workup.    FINAL CLINICAL IMPRESSION(S) / ED DIAGNOSES   Final diagnoses:  Upper respiratory tract infection, unspecified type     Rx / DC Orders   ED Discharge Orders     None        Note:  This document was prepared using Dragon voice recognition software and may include unintentional dictation errors.   Marquette Old, PA-C 05/09/22 1818    Harvest Dark, MD 05/09/22 1845

## 2022-05-09 NOTE — ED Triage Notes (Signed)
Pt reports headache, fever, chills and cough for several days.

## 2022-05-16 ENCOUNTER — Other Ambulatory Visit: Payer: Self-pay

## 2022-05-16 ENCOUNTER — Observation Stay
Admission: EM | Admit: 2022-05-16 | Discharge: 2022-05-16 | Disposition: A | Discharge status: Home / Self Care | Payer: Medicaid Other | Source: Home / Self Care | Admitting: Obstetrics and Gynecology

## 2022-05-16 ENCOUNTER — Encounter: Payer: Self-pay | Admitting: Obstetrics and Gynecology

## 2022-05-16 ENCOUNTER — Telehealth: Payer: Medicaid Other | Admitting: Family Medicine

## 2022-05-16 DIAGNOSIS — Z3A39 39 weeks gestation of pregnancy: Secondary | ICD-10-CM | POA: Insufficient documentation

## 2022-05-16 DIAGNOSIS — N898 Other specified noninflammatory disorders of vagina: Secondary | ICD-10-CM

## 2022-05-16 DIAGNOSIS — O479 False labor, unspecified: Secondary | ICD-10-CM | POA: Diagnosis present

## 2022-05-16 DIAGNOSIS — O471 False labor at or after 37 completed weeks of gestation: Secondary | ICD-10-CM | POA: Insufficient documentation

## 2022-05-16 NOTE — Discharge Summary (Signed)
Patient ID: Brittany Estrada MRN: [id number] DOB/AGE: 1995/07/03 27 y.o.  Admit date: 05/16/2022 Discharge date: 05/16/2022  Admission Diagnoses: 27yo G2P1 at [redacted]w[redacted]d presents with uterine contractions  Discharge Diagnoses: Early or false labor  Factors complicating pregnancy: GERD  Eczema  Prenatal Procedures: NST  Consults: None  Significant Diagnostic Studies:  No results found for this or any previous visit (from the past 168 hour(s)).  Treatments: none  Hospital Course:  This is a 27 y.o. G2P1001 with IUP at [redacted]w[redacted]d seen for a labor check, noted to have a cervical exam of 2/60/high.  No leaking of fluid and no bleeding.  She was observed, fetal heart rate monitoring remained reassuring, and she had no signs/symptoms of progressing labor or other maternal-fetal concerns.  Her cervical exam was unchanged from admission.  She was deemed stable for discharge to home with outpatient follow up.  Discharge Physical Exam:  BP 121/86 (BP Location: Left Arm)   Pulse 80   Temp 98.6 F (37 C) (Oral)   Resp 18   LMP 08/16/2021 (Exact Date)   General: NAD CV: RRR Pulm: nl effort ABD: s/nd/nt, gravid DVT Evaluation: LE non-ttp, no evidence of DVT on exam.  NST: FHR baseline: 145 bpm Variability: moderate Accelerations: yes Decelerations: none Category/reactivity: reactive  TOCO: every 4-9 min SVE:  Dilation: 2 Effacement (%): 60 Cervical Position: Posterior Station: -3 Presentation: Vertex Exam by:: Greeson RN   Discharge Condition: Stable  Disposition:  Discharge disposition: 01-Home or Self Care        Allergies as of 05/16/2022   No Known Allergies      Medication List     STOP taking these medications    ibuprofen 600 MG tablet Commonly known as: ADVIL   metoCLOPramide 10 MG tablet Commonly known as: REGLAN   Tri-Sprintec 0.18/0.215/0.25 MG-35 MCG tablet Generic drug: Norgestimate-Ethinyl Estradiol Triphasic       TAKE these medications     famotidine 20 MG tablet Commonly known as: PEPCID Take 1 tablet (20 mg total) by mouth 2 (two) times daily.   prenatal multivitamin Tabs tablet Take 1 tablet by mouth daily at 12 noon.        Follow-up [place] [place] Follow up.   Specialty: General Practice Why: Keep all scheduled appointments Contact information: [address]. [place] [address] [phone]                 Signed:  Regina Eck 05/16/2022 4:31 PM

## 2022-05-16 NOTE — Progress Notes (Signed)
For the safety of you and your child, I recommend a face to face office visit with a health care provider.  Many mothers need to take medicines during their pregnancy and while nursing.  Almost all medicines pass into the breast milk in small quantities.  Most are generally considered safe for a mother to take but some medicines must be avoided.  After reviewing your E-Visit request, I recommend that you consult your OB/GYN or pediatrician for medical advice in relation to your condition and prescription medications while pregnant or breastfeeding.  NOTE:  There will be NO CHARGE for this eVisit  If you are having a true medical emergency please call 911.    For an urgent face to face visit, [facility] has six urgent care centers for your convenience:     [facility] [place] Get Driving Directions [phone] [address] [place], [place] [address]    [facility] ([place]) Get Driving Directions [phone] [address] [place], [place] [address]  [facility] ([place]) Get Driving Directions [phone] [address] [place],  [place]  [address]  [facility] Get Driving Directions [phone] [address] [place], [place] [address]   [facility] [place] Get Driving Directions  [phone] [address] [place], [place] [address]   [facility] [place] Get Driving Directions [phone] [address] [place], [place] [address]  Your MyChart E-visit questionnaire answers were reviewed by a board certified advanced clinical practitioner to complete your personal care plan based on your specific symptoms.  Thank you for using e-Visits.    

## 2022-05-16 NOTE — Progress Notes (Signed)
Discharge instructions provided to patient. Patient verbalized understanding. Pt educated on signs and symptoms of labor, vaginal bleeding, LOF, and fetal movement. Red flag signs reviewed by RN. Patient discharged home in stable condition.  °

## 2022-05-16 NOTE — OB Triage Note (Signed)
Patient is a 27 yo, G2P1, at 39 weeks 0 days. Patient presents with complaints of cramping and contractions that started last night and have intensified throughout the day. Patient states the contractions are about every 5 minutes and rates the 6/10 on the pain scale. Patient denies any vaginal bleeding or LOF. Patient reports +FM. Monitors applied and assessing. VSS. Initial fetal heart tone 145. [place], CNM notified of patients arrival to unit. Plan to place in observation for labor eval and fetal monitoring.

## 2022-05-17 ENCOUNTER — Inpatient Hospital Stay
Admission: EM | Admit: 2022-05-17 | Discharge: 2022-05-18 | DRG: 807 | Disposition: A | Discharge status: Home / Self Care | Payer: Medicaid Other | Attending: Obstetrics and Gynecology | Admitting: Obstetrics and Gynecology

## 2022-05-17 ENCOUNTER — Encounter: Payer: Self-pay | Admitting: Obstetrics and Gynecology

## 2022-05-17 DIAGNOSIS — Z3A39 39 weeks gestation of pregnancy: Secondary | ICD-10-CM

## 2022-05-17 DIAGNOSIS — K219 Gastro-esophageal reflux disease without esophagitis: Secondary | ICD-10-CM | POA: Diagnosis present

## 2022-05-17 DIAGNOSIS — O26893 Other specified pregnancy related conditions, third trimester: Secondary | ICD-10-CM | POA: Diagnosis present

## 2022-05-17 DIAGNOSIS — O9962 Diseases of the digestive system complicating childbirth: Secondary | ICD-10-CM | POA: Diagnosis present

## 2022-05-17 LAB — URINE DRUG SCREEN, QUALITATIVE (ARMC ONLY)
Amphetamines, Ur Screen: NOT DETECTED
Barbiturates, Ur Screen: NOT DETECTED
Benzodiazepine, Ur Scrn: NOT DETECTED
Cannabinoid 50 Ng, Ur ~~LOC~~: POSITIVE — AB
Cocaine Metabolite,Ur ~~LOC~~: NOT DETECTED
MDMA (Ecstasy)Ur Screen: NOT DETECTED
Methadone Scn, Ur: NOT DETECTED
Opiate, Ur Screen: NOT DETECTED
Phencyclidine (PCP) Ur S: NOT DETECTED
Tricyclic, Ur Screen: NOT DETECTED

## 2022-05-17 LAB — CBC
HCT: 36.4 % (ref 36.0–46.0)
Hemoglobin: 12.6 g/dL (ref 12.0–15.0)
MCH: 28.1 pg (ref 26.0–34.0)
MCHC: 34.6 g/dL (ref 30.0–36.0)
MCV: 81.1 fL (ref 80.0–100.0)
Platelets: 243 10*3/uL (ref 150–400)
RBC: 4.49 MIL/uL (ref 3.87–5.11)
RDW: 12.5 % (ref 11.5–15.5)
WBC: 10.3 10*3/uL (ref 4.0–10.5)
nRBC: 0 % (ref 0.0–0.2)

## 2022-05-17 LAB — TYPE AND SCREEN
ABO/RH(D): O POS
Antibody Screen: NEGATIVE

## 2022-05-17 MED ORDER — PRENATAL MULTIVITAMIN CH
1.0000 | ORAL_TABLET | Freq: Every day | ORAL | Status: DC
  Administered 2022-05-18: 1 via ORAL
  Filled 2022-05-17: qty 1

## 2022-05-17 MED ORDER — OXYCODONE HCL 5 MG PO TABS: 5.0000 mg | ORAL_TABLET | ORAL | Status: DC | PRN

## 2022-05-17 MED ORDER — MISOPROSTOL 200 MCG PO TABS
ORAL_TABLET | ORAL | Status: AC
  Filled 2022-05-17: qty 4

## 2022-05-17 MED ORDER — LIDOCAINE HCL (PF) 1 % IJ SOLN
30.0000 mL | INTRAMUSCULAR | Status: DC | PRN
  Filled 2022-05-17: qty 30

## 2022-05-17 MED ORDER — COCONUT OIL OIL: 1.0000 | TOPICAL_OIL | Status: DC | PRN

## 2022-05-17 MED ORDER — AMMONIA AROMATIC IN INHA
RESPIRATORY_TRACT | Status: AC
  Filled 2022-05-17: qty 10

## 2022-05-17 MED ORDER — SENNOSIDES-DOCUSATE SODIUM 8.6-50 MG PO TABS
2.0000 | ORAL_TABLET | Freq: Every day | ORAL | Status: DC
  Administered 2022-05-18: 2 via ORAL
  Filled 2022-05-17: qty 2

## 2022-05-17 MED ORDER — DIBUCAINE (PERIANAL) 1 % EX OINT
1.0000 | TOPICAL_OINTMENT | CUTANEOUS | Status: DC | PRN
  Administered 2022-05-17: 1 via RECTAL
  Filled 2022-05-17: qty 28

## 2022-05-17 MED ORDER — IBUPROFEN 600 MG PO TABS
600.0000 mg | ORAL_TABLET | Freq: Four times a day (QID) | ORAL | Status: DC
  Administered 2022-05-17 – 2022-05-18 (×4): 600 mg via ORAL
  Filled 2022-05-17 (×4): qty 1

## 2022-05-17 MED ORDER — OXYTOCIN BOLUS FROM INFUSION
333.0000 mL | Freq: Once | INTRAVENOUS | Status: AC
  Administered 2022-05-17: 333 mL via INTRAVENOUS

## 2022-05-17 MED ORDER — OXYCODONE-ACETAMINOPHEN 5-325 MG PO TABS: 1.0000 | ORAL_TABLET | ORAL | Status: DC | PRN

## 2022-05-17 MED ORDER — OXYCODONE-ACETAMINOPHEN 5-325 MG PO TABS: 2.0000 | ORAL_TABLET | ORAL | Status: DC | PRN

## 2022-05-17 MED ORDER — ONDANSETRON HCL 4 MG/2ML IJ SOLN: 4.0000 mg | INTRAMUSCULAR | Status: DC | PRN

## 2022-05-17 MED ORDER — ONDANSETRON HCL 4 MG/2ML IJ SOLN: 4.0000 mg | Freq: Four times a day (QID) | INTRAMUSCULAR | Status: DC | PRN

## 2022-05-17 MED ORDER — SIMETHICONE 80 MG PO CHEW: 80.0000 mg | CHEWABLE_TABLET | ORAL | Status: DC | PRN

## 2022-05-17 MED ORDER — TETANUS-DIPHTH-ACELL PERTUSSIS 5-2.5-18.5 LF-MCG/0.5 IM SUSY
0.5000 mL | PREFILLED_SYRINGE | Freq: Once | INTRAMUSCULAR | Status: DC
  Filled 2022-05-17: qty 0.5

## 2022-05-17 MED ORDER — OXYCODONE HCL 5 MG PO TABS: 10.0000 mg | ORAL_TABLET | ORAL | Status: DC | PRN

## 2022-05-17 MED ORDER — SOD CITRATE-CITRIC ACID 500-334 MG/5ML PO SOLN: 30.0000 mL | ORAL | Status: DC | PRN

## 2022-05-17 MED ORDER — OXYTOCIN-SODIUM CHLORIDE 30-0.9 UT/500ML-% IV SOLN
2.5000 [IU]/h | INTRAVENOUS | Status: DC
  Filled 2022-05-17: qty 500

## 2022-05-17 MED ORDER — LACTATED RINGERS IV SOLN: INTRAVENOUS | Status: DC

## 2022-05-17 MED ORDER — SODIUM CHLORIDE 0.9 % IV SOLN: 5.0000 10*6.[IU] | Freq: Once | INTRAVENOUS | Status: DC

## 2022-05-17 MED ORDER — WITCH HAZEL-GLYCERIN EX PADS
1.0000 | MEDICATED_PAD | CUTANEOUS | Status: DC | PRN
  Administered 2022-05-17: 1 via TOPICAL
  Filled 2022-05-17: qty 100

## 2022-05-17 MED ORDER — LACTATED RINGERS IV SOLN: 500.0000 mL | INTRAVENOUS | Status: DC | PRN

## 2022-05-17 MED ORDER — FENTANYL CITRATE (PF) 100 MCG/2ML IJ SOLN
50.0000 ug | INTRAMUSCULAR | Status: DC | PRN
  Administered 2022-05-17: 50 ug via INTRAVENOUS
  Filled 2022-05-17 (×2): qty 2

## 2022-05-17 MED ORDER — ACETAMINOPHEN 325 MG PO TABS
650.0000 mg | ORAL_TABLET | ORAL | Status: DC | PRN
  Administered 2022-05-17: 650 mg via ORAL
  Filled 2022-05-17: qty 2

## 2022-05-17 MED ORDER — IBUPROFEN 600 MG PO TABS
ORAL_TABLET | ORAL | Status: AC
  Administered 2022-05-17: 600 mg via ORAL
  Filled 2022-05-17: qty 1

## 2022-05-17 MED ORDER — ACETAMINOPHEN 325 MG PO TABS: 650.0000 mg | ORAL_TABLET | ORAL | Status: DC | PRN

## 2022-05-17 MED ORDER — BENZOCAINE-MENTHOL 20-0.5 % EX AERO
1.0000 | INHALATION_SPRAY | CUTANEOUS | Status: DC | PRN
  Administered 2022-05-17: 1 via TOPICAL
  Filled 2022-05-17: qty 56

## 2022-05-17 MED ORDER — OXYTOCIN 10 UNIT/ML IJ SOLN
INTRAMUSCULAR | Status: AC
  Filled 2022-05-17: qty 2

## 2022-05-17 MED ORDER — DIPHENHYDRAMINE HCL 25 MG PO CAPS: 25.0000 mg | ORAL_CAPSULE | Freq: Four times a day (QID) | ORAL | Status: DC | PRN

## 2022-05-17 MED ORDER — ONDANSETRON HCL 4 MG PO TABS: 4.0000 mg | ORAL_TABLET | ORAL | Status: DC | PRN

## 2022-05-17 MED ORDER — PENICILLIN G POT IN DEXTROSE 60000 UNIT/ML IV SOLN: 3.0000 10*6.[IU] | INTRAVENOUS | Status: DC

## 2022-05-17 NOTE — OB Triage Note (Signed)
Brittany Estrada 26 y.o. presents to Labor & Delivery triage via wheelchair steered by ED staff reporting contractions that started around 10pm that were stronger than when she was here yesterday.. She is a G2P1001 at [redacted]w[redacted]d . She denies signs and symptoms consistent with rupture of membranes or active vaginal bleeding. She states positive fetal movement. External FM and TOCO applied to non-tender abdomen. Initial FHR 150. Vital signs obtained and within normal limits. Patient oriented to care environment including call bell and bed control use. Linda Hedges, CNM notified of patient's arrival.

## 2022-05-17 NOTE — Discharge Summary (Signed)
Obstetrical Discharge Summary  Patient Name: Brittany Estrada DOB: 17-Apr-1995 MRN: [id number]  Date of Admission: 05/17/2022 Date of Delivery: 05/17/22 Delivered by: Haroldine Laws, CNM  Date of Discharge: 05/18/2022  Primary OB: [facility] BZJ:IRCVELF'Y last menstrual period was 08/16/2021 (exact date). EDC Estimated Date of Delivery: 05/23/22 Gestational Age at Delivery: [redacted]w[redacted]d   Antepartum complications:  GERD  Eczema Depression  Admitting Diagnosis: Normal labor [O80, Z37.9]  Secondary Diagnosis: Patient Active Problem List   Diagnosis Date Noted   Normal labor 05/17/2022   Uterine contractions 05/16/2022   Back pain affecting pregnancy in third trimester 04/23/2022   Labor and delivery indication for care or intervention 07/02/2017   PIH (pregnancy induced hypertension) 07/02/2017   High-risk pregnancy 07/02/2017    Discharge Diagnosis: Term Pregnancy Delivered      Augmentation: N/A Complications: None Intrapartum complications/course: see delivery note Delivery Type: spontaneous vaginal delivery Anesthesia: non-pharmacological methods Placenta: spontaneous To Pathology: No  Laceration: none Episiotomy: none Newborn Data: Live born female  Birth Weight:  6lbs 10.2 oz APGAR: 8, 9  Newborn Delivery   Birth date/time: 05/17/2022 08:54:00 Delivery type: Vaginal, Spontaneous      Postpartum Procedures: none Edinburgh:     05/17/2022    7:50 PM  Edinburgh Postnatal Depression Scale Screening Tool  I have been able to laugh and see the funny side of things. 0  I have looked forward with enjoyment to things. 0  I have blamed myself unnecessarily when things went wrong. 0  I have been anxious or worried for no good reason. 0  I have felt scared or panicky for no good reason. 0  Things have been getting on top of me. 0  I have been so unhappy that I have had difficulty sleeping. 0  I have felt sad or miserable. 0  I have been so unhappy that I have been  crying. 0  The thought of harming myself has occurred to me. 0  Edinburgh Postnatal Depression Scale Total 0     Post partum course:  Patient had an uncomplicated postpartum course.  By time of discharge on PPD#1, her pain was controlled on oral pain medications; she had appropriate lochia and was ambulating, voiding without difficulty and tolerating regular diet.  She was deemed stable for discharge to home.    Discharge Physical Exam:  BP 119/79 (BP Location: Right Arm)   Pulse 72   Temp 98 F (36.7 C) (Oral)   Resp 18   Ht 5\' 4"  (1.626 m)   Wt 66.2 kg Comment: 146lbs  LMP 08/16/2021 (Exact Date)   SpO2 98%   Breastfeeding Unknown   BMI 25.06 kg/m   General: NAD CV: RRR Pulm: CTABL, nl effort ABD: s/nd/nt, fundus firm and below the umbilicus Lochia: moderate Perineum: minimal edema/intact DVT Evaluation: LE non-ttp, no evidence of DVT on exam.  Hemoglobin  Date Value Ref Range Status  05/18/2022 10.4 (L) 12.0 - 15.0 g/dL Final   HGB  Date Value Ref Range Status  04/11/2012 12.2 12.0 - 16.0 g/dL Final   HCT  Date Value Ref Range Status  05/18/2022 30.9 (L) 36.0 - 46.0 % Final  04/11/2012 36.3 35.0 - 47.0 % Final    Risk assessment for postpartum VTE and prophylactic treatment: Very high risk factors: None High risk factors: None Moderate risk factors: None  Postpartum VTE prophylaxis with LMWH not indicated  Disposition: stable, discharge to home. Baby Feeding: formula feeding Baby Disposition: home with mom  Rh Immune globulin  indicated: No Rubella vaccine given: was not indicated Varivax vaccine given: was not indicated Flu vaccine given in AP setting: Yes  Tdap vaccine given in AP setting: Yes   Contraception:  TBD  Prenatal Labs:  Blood type/Rh O pos  Antibody screen neg  Rubella Immune  Varicella Immune  RPR NR  HBsAg Neg  HIV NR  GC neg  Chlamydia neg  Genetic screening    1 hour GTT 97  3 hour GTT    GBS Neg    Plan:  Brittany Estrada was discharged to home in good condition. Follow-up appointment with prenatal provider in 2 weeks.  Discharge Medications: Allergies as of 05/18/2022   No Known Allergies      Medication List     TAKE these medications    acetaminophen 325 MG tablet Commonly known as: Tylenol Take 2 tablets (650 mg total) by mouth every 4 (four) hours as needed (for pain scale < 4).   famotidine 20 MG tablet Commonly known as: PEPCID Take 1 tablet (20 mg total) by mouth 2 (two) times daily.   ibuprofen 600 MG tablet Commonly known as: ADVIL Take 1 tablet (600 mg total) by mouth every 6 (six) hours as needed for mild pain or cramping.   prenatal multivitamin Tabs tablet Take 1 tablet by mouth daily at 12 noon.         Follow-up Information     Center, [facility]. Schedule an appointment as soon as possible for a visit in 2 week(s).   Specialty: General Practice Why: postpartum mood check Contact information: [address] [place] [address] [phone]         Center, [facility]. Schedule an appointment as soon as possible for a visit in 6 week(s).   Specialty: General Practice Why: postpartum visit Contact information: [address] [place] [address] [phone]                 Signed:  Margaretmary Eddy, CNM Certified Nurse Midwife [facility]

## 2022-05-17 NOTE — H&P (Signed)
OB History & Physical   History of Present Illness:  Chief Complaint:   HPI:  Brittany Estrada is a 27 y.o. G52P1001 female at [redacted]w[redacted]d dated by LMP.  She presents to L&D for SROM and active labor  She reports:  -active fetal movement -LOF/SROM  -no vaginal bleeding -onset of contractions at 2200 currently every 2-5 minutes  Pregnancy Issues: GERD  Eczema Depression   Maternal Medical History:   Past Medical History:  Diagnosis Date   ADHD    Depression     Past Surgical History:  Procedure Laterality Date   NO PAST SURGERIES      No Known Allergies  Prior to Admission medications   Medication Sig Start Date End Date Taking? Authorizing Provider  Prenatal Vit-Fe Fumarate-FA (PRENATAL MULTIVITAMIN) TABS tablet Take 1 tablet by mouth daily at 12 noon.   Yes [provider]  famotidine (PEPCID) 20 MG tablet Take 1 tablet (20 mg total) by mouth 2 (two) times daily. 09/22/21   Carrie Mew, MD     Prenatal care site: [facility]   Social History: She  reports that she has never smoked. She has never used smokeless tobacco. She reports that she does not drink alcohol and does not use drugs.  Family History: family history includes Kidney failure in her father.   Review of Systems: A full review of systems was performed and negative except as noted in the HPI.    Physical Exam:  Vital Signs: BP 130/83 (BP Location: Right Arm)   Pulse 73   Temp 98.8 F (37.1 C) (Oral)   Resp 18   LMP 08/16/2021 (Exact Date)   General:   alert and cooperative  Skin:  normal  Neurologic:    Alert & oriented x 3  Lungs:    Nl effort  Heart:   regular rate and rhythm  Abdomen:   gravid  Extremities: : non-tender, symmetric, no edema bilaterally.      Results for orders placed or performed during the hospital encounter of 05/17/22 (from the past 24 hour(s))  CBC     Status: None   Collection Time: 05/17/22  7:53 AM  Result Value Ref Range   WBC 10.3 4.0 -  10.5 K/uL   RBC 4.49 3.87 - 5.11 MIL/uL   Hemoglobin 12.6 12.0 - 15.0 g/dL   HCT 36.4 36.0 - 46.0 %   MCV 81.1 80.0 - 100.0 fL   MCH 28.1 26.0 - 34.0 pg   MCHC 34.6 30.0 - 36.0 g/dL   RDW 12.5 11.5 - 15.5 %   Platelets 243 150 - 400 K/uL   nRBC 0.0 0.0 - 0.2 %  Type and screen [place]     Status: None   Collection Time: 05/17/22  7:53 AM  Result Value Ref Range   ABO/RH(D) O POS    Antibody Screen NEG    Sample Expiration      05/20/2022,2359 Performed at [facility], [address], [place], [place] [address]     Pertinent Results:  Prenatal Labs: Blood type/Rh O pos  Antibody screen neg  Rubella Immune  Varicella Immune  RPR NR  HBsAg Neg  HIV NR  GC neg  Chlamydia neg  Genetic screening   1 hour GTT 97  3 hour GTT   GBS Neg   FHT: FHR: 155 bpm, variability: moderate,  accelerations:  Present,  decelerations:  Absent Category/reactivity:  Category I TOCO: regular, every 2-5 minutes SVE:  Dilation: 7 / Effacement (%): 90 / Station: 0     Assessment:  Brittany Estrada is a 27 y.o. G82P1001 female at [redacted]w[redacted]d with SROM and active labor.   Plan:  1. Admit to Labor & Delivery; consents reviewed and obtained  2. Fetal Well being  - Fetal Tracing: Cat I - GBS neg - Presentation: vtx confirmed by sve   3. Routine OB: - Prenatal labs reviewed, as above - Rh pos - CBC & T&S on admit - Clear fluids, IVF  4. Monitoring of Labor -  Contractions by external toco in place -  Plan for continuous fetal monitoring  -  Maternal pain control as desired: IVPM, nitrous, regional anesthesia - Anticipate vaginal delivery  5. Post Partum Planning: - Infant feeding: Breastfeeding - Contraception: TBD - Flu: 12/16/21 - TDap: 03/11/22  Krissa Utke, CNM 05/17/2022 9:15 AM

## 2022-05-18 ENCOUNTER — Encounter: Payer: Self-pay | Admitting: Obstetrics and Gynecology

## 2022-05-18 LAB — RPR: RPR Ser Ql: NONREACTIVE

## 2022-05-18 LAB — CBC
HCT: 30.9 % — ABNORMAL LOW (ref 36.0–46.0)
Hemoglobin: 10.4 g/dL — ABNORMAL LOW (ref 12.0–15.0)
MCH: 28 pg (ref 26.0–34.0)
MCHC: 33.7 g/dL (ref 30.0–36.0)
MCV: 83.1 fL (ref 80.0–100.0)
Platelets: 198 10*3/uL (ref 150–400)
RBC: 3.72 MIL/uL — ABNORMAL LOW (ref 3.87–5.11)
RDW: 12.6 % (ref 11.5–15.5)
WBC: 12.5 10*3/uL — ABNORMAL HIGH (ref 4.0–10.5)
nRBC: 0 % (ref 0.0–0.2)

## 2022-05-18 MED ORDER — ACETAMINOPHEN 325 MG PO TABS: 650.0000 mg | ORAL_TABLET | ORAL | Status: AC | PRN

## 2022-05-18 MED ORDER — IBUPROFEN 600 MG PO TABS: 600.0000 mg | ORAL_TABLET | Freq: Four times a day (QID) | ORAL | 0 refills | Status: AC | PRN

## 2022-05-18 NOTE — Clinical Social Work Maternal (Signed)
  CLINICAL SOCIAL WORK MATERNAL/CHILD NOTE  Patient Details  Name: Brittany Estrada MRN: [id number] Date of Birth: 12-17-1995  Date:  05/18/2022  Clinical Social Worker Initiating Note:  [facility] [facility], [place] Date/Time: Initiated:  05/18/22/1200     Child's Name:  Brittany Estrada   Biological Parents:  Mother, Father   Need for Interpreter:  None   Reason for Referral:   (MOB positive for marijuana)   Address:  [address] [place] [address]    Phone number:  [phone] (home)     Additional phone number:   Household Members/Support Persons (HM/SP):   Household Member/Support Person 1   HM/SP Name Relationship DOB or Age  HM/SP -1 Brittany Estrada FOB    HM/SP -2        HM/SP -3        HM/SP -4        HM/SP -5        HM/SP -6        HM/SP -7        HM/SP -8          Natural Supports (not living in the home):  Parent   Professional Supports:     Employment: Unemployed   Type of Work:     Education:  Attending college   Homebound arranged:    Museum/gallery curator Resources:      Other Resources:      Cultural/Religious Considerations Which May Impact Care:    Strengths:  Ability to meet basic needs  , Compliance with medical plan     Psychotropic Medications:         Pediatrician:       Pediatrician List:   Brittany Estrada      Pediatrician Fax Number: Brittany Estrada  Risk Factors/Current Problems:  Substance Use     Cognitive State:  Alert     Mood/Affect:  Calm     CSW Assessment:   TOC consulted for patient testing positive for Marijuana however infant did not. Patient is aware, she reports she was smoking many months ago but has stopped since she's been pregnant. She reports no other substance use.  Confirms phone number and address in chart as accurate. Patient has a 25 y/o daughter at home Brittany Estrada. Reports newborn baby's name is Brittany Estrada, father  of children is Brittany Estrada. Patient reports they both live with Brittany's mother who provides support and assistance as needed.   Patient reports having high school education and is currently in college, not working however Brittany Estrada is.   No hx of mental health therapy or medications, reports being aware of PPD and SIDs.   Pediatrician follow up is scheduled for tomorrow at [facility].  Reports having bassinet at home, has diapers and formula, reports no needs at this time.   TOC also consulted with RN, no noted concerns for discharge at this time.    CSW Plan/Description:  No Further Intervention Required/No Barriers to Discharge    Brittany Bash, LCSW 05/18/2022, 12:02 PM

## 2022-05-18 NOTE — Progress Notes (Signed)
Verb understanding of d/c instructions   D/C to home

## 2022-05-18 NOTE — Discharge Instructions (Signed)
Vaginal Delivery, Care After Refer to this sheet in the next few weeks. These discharge instructions provide you with information on caring for yourself after delivery. Your caregiver may also give you specific instructions. Your treatment has been planned according to the most current medical practices available, but problems sometimes occur. Call your caregiver if you have any problems or questions after you go home. HOME CARE INSTRUCTIONS Take over-the-counter or prescription medicines only as directed by your caregiver or pharmacist. Do not drink alcohol, especially if you are breastfeeding or taking medicine to relieve pain. Do not smoke tobacco. Continue to use good perineal care. Good perineal care includes: Wiping your perineum from back to front Keeping your perineum clean. You can do sitz baths twice a day, to help keep this area clean Do not use tampons, douche or have sex until your caregiver says it is okay. Shower only and avoid sitting in submerged water, aside from sitz baths Wear a well-fitting bra that provides breast support. Eat healthy foods. Drink enough fluids to keep your urine clear or pale yellow. Eat high-fiber foods such as whole grain cereals and breads, brown rice, beans, and fresh fruits and vegetables every day. These foods may help prevent or relieve constipation. Avoid constipation with high fiber foods or medications, such as miralax or metamucil Follow your caregiver's recommendations regarding resumption of activities such as climbing stairs, driving, lifting, exercising, or traveling. Talk to your caregiver about resuming sexual activities. Resumption of sexual activities is dependent upon your risk of infection, your rate of healing, and your comfort and desire to resume sexual activity. Try to have someone help you with your household activities and your newborn for at least a few days after you leave the hospital. Rest as much as possible. Try to rest or  take a nap when your newborn is sleeping. Increase your activities gradually. Keep all of your scheduled postpartum appointments. It is very important to keep your scheduled follow-up appointments. At these appointments, your caregiver will be checking to make sure that you are healing physically and emotionally. SEEK MEDICAL CARE IF:  You are passing large clots from your vagina. Save any clots to show your caregiver. You have a foul smelling discharge from your vagina. You have trouble urinating. You are urinating frequently. You have pain when you urinate. You have a change in your bowel movements. You have increasing redness, pain, or swelling near your vaginal incision (episiotomy) or vaginal tear. You have pus draining from your episiotomy or vaginal tear. Your episiotomy or vaginal tear is separating. You have painful, hard, or reddened breasts. You have a severe headache. You have blurred vision or see spots. You feel sad or depressed. You have thoughts of hurting yourself or your newborn. You have questions about your care, the care of your newborn, or medicines. You are dizzy or light-headed. You have a rash. You have nausea or vomiting. You were breastfeeding and have not had a menstrual period within 12 weeks after you stopped breastfeeding. You are not breastfeeding and have not had a menstrual period by the 12th week after delivery. You have a fever. SEEK IMMEDIATE MEDICAL CARE IF:  You have persistent pain. You have chest pain. You have shortness of breath. You faint. You have leg pain. You have stomach pain. Your vaginal bleeding saturates two or more sanitary pads in 1 hour. MAKE SURE YOU:  Understand these instructions. Will watch your condition. Will get help right away if you are not doing well or   get worse. Document Released: 02/14/2000 Document Revised: 07/03/2013 Document Reviewed: 10/14/2011 ExitCare Patient Information 2015 [employer]. This  information is not intended to replace advice given to you by your health care provider. Make sure you discuss any questions you have with your health care provider.  Sitz Bath A sitz bath is a warm water bath taken in the sitting position. The water covers only the hips and butt (buttocks). We recommend using one that fits in the toilet, to help with ease of use and cleanliness. It may be used for either healing or cleaning purposes. Sitz baths are also used to relieve pain, itching, or muscle tightening (spasms). The water may contain medicine. Moist heat will help you heal and relax.  HOME CARE  Take 3 to 4 sitz baths a day. Fill the bathtub half-full with warm water. Sit in the water and open the drain a little. Turn on the warm water to keep the tub half-full. Keep the water running constantly. Soak in the water for 15 to 20 minutes. After the sitz bath, pat the affected area dry. GET HELP RIGHT AWAY IF: You get worse instead of better. Stop the sitz baths if you get worse. MAKE SURE YOU: Understand these instructions. Will watch your condition. Will get help right away if you are not doing well or get worse. Document Released: 03/26/2004 Document Revised: 11/11/2011 Document Reviewed: 06/16/2010 ExitCare Patient Information 2015 [employer]. This information is not intended to replace advice given to you by your health care provider. Make sure you discuss any questions you have with your health care provider.     

## 2022-05-23 ENCOUNTER — Inpatient Hospital Stay: Admit: 2022-05-23 | Payer: Self-pay

## 2022-06-17 ENCOUNTER — Telehealth: Payer: Self-pay

## 2022-06-17 NOTE — Telephone Encounter (Signed)
[facility]- Discharge Call Backs-Spoke with pt on the phone about the following. 1-Do you have any questions or concerns about yourself as you heal?  No 2-Any concerns or questions about your baby? No 3-Reviewed ABC's of safe sleep. 4-How was your stay at the hospital?Great 5-Did our team work together to care for you?Yes You should be receiving a survey in the mail soon.   We would really appreciate it if you could fill that out for [place] and return it in the mail.  We value the feedback to make improvements and continue the great work we do.   If you have any questions please feel free to call me back at [phone]

## 2023-10-05 ENCOUNTER — Other Ambulatory Visit: Payer: Self-pay | Admitting: Family Medicine

## 2023-10-05 DIAGNOSIS — R0789 Other chest pain: Secondary | ICD-10-CM

## 2024-03-04 ENCOUNTER — Other Ambulatory Visit: Payer: Self-pay

## 2024-03-04 ENCOUNTER — Emergency Department
Admission: EM | Admit: 2024-03-04 | Discharge: 2024-03-04 | Disposition: A | Discharge status: Home / Self Care | Attending: Emergency Medicine | Admitting: Emergency Medicine

## 2024-03-04 DIAGNOSIS — R059 Cough, unspecified: Secondary | ICD-10-CM | POA: Diagnosis present

## 2024-03-04 DIAGNOSIS — J111 Influenza due to unidentified influenza virus with other respiratory manifestations: Secondary | ICD-10-CM | POA: Insufficient documentation

## 2024-03-04 MED ORDER — BENZONATATE 100 MG PO CAPS: 100.0000 mg | ORAL_CAPSULE | Freq: Three times a day (TID) | ORAL | 0 refills | Status: AC | PRN

## 2024-03-04 NOTE — ED Triage Notes (Signed)
 Pt to Ed via POV from home. Pt reports cousin dx with Flu last pm and she is now experiencing body aches, cough, congestion and fevers.

## 2024-03-04 NOTE — ED Provider Notes (Signed)
 "   [facility] Emergency Department Provider Note     Event Date/Time   First MD Initiated Contact with Patient 03/04/24 1507     (approximate)   History   Generalized Body Aches   HPI  Brittany Estrada is a 29 y.o. female presents to the ED for evaluation of nonproductive cough, generalized bodyaches and hot and cold chills x 2 days.  Patient reports her family member was recently diagnosed with influenza.  She denies any fevers.  Denies abdominal pain, nausea or vomiting.  No chest pain or shortness of breath.     Physical Exam   Triage Vital Signs: ED Triage Vitals [03/04/24 1348]  Encounter Vitals Group     BP 116/77     Girls Systolic BP Percentile      Girls Diastolic BP Percentile      Boys Systolic BP Percentile      Boys Diastolic BP Percentile      Pulse Rate 91     Resp 18     Temp 98.7 F (37.1 C)     Temp Source Oral     SpO2 98 %     Weight      Height      Head Circumference      Peak Flow      Pain Score 7     Pain Loc      Pain Education      Exclude from Growth Chart     Most recent vital signs: Vitals:   03/04/24 1348  BP: 116/77  Pulse: 91  Resp: 18  Temp: 98.7 F (37.1 C)  SpO2: 98%    General Awake, no distress.  HEENT NCAT. PERRL. EOMI. No rhinorrhea. Mucous membranes are moist.  CV:  Good peripheral perfusion.  RRR RESP:  Normal effort.  LCTAB ABD:  No distention.  Other:  Oropharynx is clear.  There is no erythema or exudates.  Uvula is midline.   ED Results / Procedures / Treatments   Labs (all labs ordered are listed, but only abnormal results are displayed) Labs Reviewed - No data to display  No results found.  PROCEDURES:  Critical Care performed: No  Procedures  MEDICATIONS ORDERED IN ED: Medications - No data to display   IMPRESSION / MDM / ASSESSMENT AND PLAN / ED COURSE  I reviewed the triage vital signs and the nursing notes.                               29 y.o. female  presents to the emergency department for evaluation and treatment of acute nonproductive cough with associated generalized bodyaches. See HPI for further details.   Differential diagnosis includes, but is not limited to viral URI, influenza, COVID  Patient's presentation is most consistent with acute, uncomplicated illness.  Patient is alert and oriented.  She is hemodynamically stable, afebrile and in no acute distress.  Physical exam findings are stated above.  Normal lung exam.  No indication for further workup.  Patient's presentation is most consistent with influenza.  Education on symptomatic care treatment provided.  Prescription for Tessalon  Perles sent to pharmacy.  Patient is in stable and satisfactory condition for discharge home at this time.  ED return precaution discussed.   FINAL CLINICAL IMPRESSION(S) / ED DIAGNOSES   Final diagnoses:  Influenza-like illness   Rx / DC Orders   ED Discharge Orders  Ordered    benzonatate  (TESSALON ) 100 MG capsule  3 times daily PRN        03/04/24 1551             Note:  This document was prepared using Dragon voice recognition software and may include unintentional dictation errors.    Margrette, Ramaya Guile A, PA-C 03/04/24 1555    Claudene Rover, MD 03/04/24 1557  "

## 2024-03-04 NOTE — Discharge Instructions (Signed)
 You were seen in the emergency department today for a cough. Your symptoms are consistent with influenza.  This is a viral upper respiratory infection.  A viral cough may last up to 2-3 weeks.   Alternate taking tylenol  or ibuprofen  for pain or fever as directed.   Stay hydrated by drinking plenty of fluids to thin mucus. Get adequate amount of sleep and avoid overexertion. Consider a humidifier at night. Warm teas and a spoonful of honey may help reduce cough frequency. Follow up with your primary care provider as needed.   Pain control:  Ibuprofen  (motrin /aleve/advil ) - You can take 3 tablets (600 mg) every 6 hours as needed for pain/fever.  Acetaminophen  (tylenol ) - You can take 2 extra strength tablets (1000 mg) every 6 hours as needed for pain/fever.  You can alternate these medications or take them together.  Make sure you eat food/drink water when taking these medications.
